# Patient Record
Sex: Female | Born: 1995 | Race: Black or African American | Hispanic: No | Marital: Single | Smoking: Never smoker
Health system: Southern US, Community
[De-identification: ages and names within clinical notes are randomized; demographics above are authoritative.]

## PROBLEM LIST (undated history)

## (undated) ENCOUNTER — Inpatient Hospital Stay (HOSPITAL_COMMUNITY): Payer: Self-pay

## (undated) DIAGNOSIS — Z87898 Personal history of other specified conditions: Secondary | ICD-10-CM

## (undated) DIAGNOSIS — J45909 Unspecified asthma, uncomplicated: Secondary | ICD-10-CM

## (undated) DIAGNOSIS — G43909 Migraine, unspecified, not intractable, without status migrainosus: Secondary | ICD-10-CM

## (undated) HISTORY — PX: MOUTH SURGERY: SHX715

---
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
## 2013-03-08 ENCOUNTER — Ambulatory Visit: Payer: BC Managed Care – PPO

## 2014-01-12 ENCOUNTER — Other Ambulatory Visit (HOSPITAL_COMMUNITY)
Admission: RE | Admit: 2014-01-12 | Discharge: 2014-01-12 | Disposition: A | Discharge status: Home / Self Care | Payer: BC Managed Care – PPO | Source: Ambulatory Visit | Attending: Family Medicine | Admitting: Family Medicine

## 2014-01-12 ENCOUNTER — Encounter (HOSPITAL_COMMUNITY): Payer: Self-pay | Admitting: Emergency Medicine

## 2014-01-12 ENCOUNTER — Emergency Department (HOSPITAL_COMMUNITY)
Admission: EM | Admit: 2014-01-12 | Discharge: 2014-01-12 | Disposition: A | Discharge status: Home / Self Care | Payer: BC Managed Care – PPO | Source: Home / Self Care | Attending: Family Medicine | Admitting: Family Medicine

## 2014-01-12 DIAGNOSIS — R102 Pelvic and perineal pain: Secondary | ICD-10-CM

## 2014-01-12 DIAGNOSIS — N760 Acute vaginitis: Secondary | ICD-10-CM | POA: Insufficient documentation

## 2014-01-12 DIAGNOSIS — N949 Unspecified condition associated with female genital organs and menstrual cycle: Secondary | ICD-10-CM

## 2014-01-12 DIAGNOSIS — Z113 Encounter for screening for infections with a predominantly sexual mode of transmission: Secondary | ICD-10-CM | POA: Insufficient documentation

## 2014-01-12 LAB — POCT URINALYSIS DIP (DEVICE)
Bilirubin Urine: NEGATIVE
Glucose, UA: NEGATIVE mg/dL
Hgb urine dipstick: NEGATIVE
KETONES UR: NEGATIVE mg/dL
LEUKOCYTES UA: NEGATIVE
Nitrite: NEGATIVE
PH: 7 (ref 5.0–8.0)
PROTEIN: NEGATIVE mg/dL
SPECIFIC GRAVITY, URINE: 1.02 (ref 1.005–1.030)
Urobilinogen, UA: 0.2 mg/dL (ref 0.0–1.0)

## 2014-01-12 LAB — POCT PREGNANCY, URINE: Preg Test, Ur: NEGATIVE

## 2014-01-12 MED ORDER — CEFTRIAXONE SODIUM 250 MG IJ SOLR
250.0000 mg | Freq: Once | INTRAMUSCULAR | Status: AC
  Administered 2014-01-12: 250 mg via INTRAMUSCULAR

## 2014-01-12 MED ORDER — AZITHROMYCIN 250 MG PO TABS
1000.0000 mg | ORAL_TABLET | Freq: Once | ORAL | Status: AC
  Administered 2014-01-12: 1000 mg via ORAL

## 2014-01-12 MED ORDER — LIDOCAINE HCL (PF) 1 % IJ SOLN
INTRAMUSCULAR | Status: AC
  Filled 2014-01-12: qty 5

## 2014-01-12 MED ORDER — METRONIDAZOLE 500 MG PO TABS: 500.0000 mg | ORAL_TABLET | Freq: Two times a day (BID) | ORAL | Status: DC

## 2014-01-12 MED ORDER — CEFTRIAXONE SODIUM 250 MG IJ SOLR
INTRAMUSCULAR | Status: AC
  Filled 2014-01-12: qty 250

## 2014-01-12 MED ORDER — AZITHROMYCIN 250 MG PO TABS
ORAL_TABLET | ORAL | Status: AC
  Filled 2014-01-12: qty 4

## 2014-01-12 NOTE — ED Provider Notes (Signed)
Candace Barry is a 18 y.o. female who presents to Urgent Care today for pelvic pain starting yesterday without injury. No urinary frequency urgency or dysuria. No vaginal discharge or bleeding. Normal bowel movements. Last menstrual period was about a month ago. Patient feels well otherwise. She feels well otherwise. No nausea vomiting or diarrhea.  Past Medical History  Diagnosis Date  . History of wheezing    History  Substance Use Topics  . Smoking status: Never Smoker   . Smokeless tobacco: Not on file  . Alcohol Use: No   ROS as above Medications: No current facility-administered medications for this encounter.   Current Outpatient Prescriptions  Medication Sig Dispense Refill  . albuterol (PROVENTIL HFA;VENTOLIN HFA) 108 (90 BASE) MCG/ACT inhaler Inhale 2 puffs into the lungs every 6 (six) hours as needed. Shortness of breath      . budesonide-formoterol (SYMBICORT) 160-4.5 MCG/ACT inhaler Inhale 2 puffs into the lungs 2 (two) times daily.      . cetirizine (ZYRTEC) 10 MG tablet Take 10 mg by mouth daily as needed. allergies      . montelukast (SINGULAIR) 10 MG tablet Take 10 mg by mouth at bedtime.      Marland Kitchen. loratadine-pseudoephedrine (CLARITIN-D 24-HOUR) 10-240 MG per 24 hr tablet Take 1 tablet by mouth daily as needed.      . metroNIDAZOLE (FLAGYL) 500 MG tablet Take 1 tablet (500 mg total) by mouth 2 (two) times daily.  14 tablet  0  . promethazine (PHENERGAN) 25 MG tablet Take 25 mg by mouth every 6 (six) hours as needed for nausea or vomiting.        Exam:  BP 122/75  Pulse 75  Temp(Src) 98.5 F (36.9 C) (Oral)  Resp 16  SpO2 100%  LMP 12/23/2013 Gen: Well NAD HEENT: EOMI,  MMM Lungs: Normal work of breathing. CTABL Heart: RRR no MRG Abd: NABS, Soft. NT, ND Exts: Brisk capillary refill, warm and well perfused.   GYN: External genitalia. Vaginal canal with thick white stringy discharge. No cervical motion tenderness. Uterus is mildly tender but normal size and  position. No adnexal masses or tenderness.  Results for orders placed during the hospital encounter of 01/12/14 (from the past 24 hour(s))  POCT URINALYSIS DIP (DEVICE)     Status: None   Collection Time    01/12/14  2:02 PM      Result Value Ref Range   Glucose, UA NEGATIVE  NEGATIVE mg/dL   Bilirubin Urine NEGATIVE  NEGATIVE   Ketones, ur NEGATIVE  NEGATIVE mg/dL   Specific Gravity, Urine 1.020  1.005 - 1.030   Hgb urine dipstick NEGATIVE  NEGATIVE   pH 7.0  5.0 - 8.0   Protein, ur NEGATIVE  NEGATIVE mg/dL   Urobilinogen, UA 0.2  0.0 - 1.0 mg/dL   Nitrite NEGATIVE  NEGATIVE   Leukocytes, UA NEGATIVE  NEGATIVE  POCT PREGNANCY, URINE     Status: None   Collection Time    01/12/14  2:13 PM      Result Value Ref Range   Preg Test, Ur NEGATIVE  NEGATIVE   No results found.  Assessment and Plan: 18 y.o. female with pelvic pain. This is associated with vaginal discharge. Cytology pending. Empiric treatment with ceftriaxone azithromycin and course of Flagyl. Followup as needed.  Discussed warning signs or symptoms. Please see discharge instructions. Patient expresses understanding.    Rodolph BongEvan S Corey, MD 01/12/14 1640

## 2014-01-12 NOTE — Discharge Instructions (Signed)
Thank you for coming in today.  Abdominal Pain Many things can cause abdominal pain. Usually, abdominal pain is not caused by a disease and will improve without treatment. It can often be observed and treated at home. Your health care provider will do a physical exam and possibly order blood tests and X-rays to help determine the seriousness of your pain. However, in many cases, more time must pass before a clear cause of the pain can be found. Before that point, your health care provider may not know if you need more testing or further treatment. HOME CARE INSTRUCTIONS  Monitor your abdominal pain for any changes. The following actions may help to alleviate any discomfort you are experiencing:  Only take over-the-counter or prescription medicines as directed by your health care provider.  Do not take laxatives unless directed to do so by your health care provider.  Try a clear liquid diet (broth, tea, or water) as directed by your health care provider. Slowly move to a bland diet as tolerated. SEEK MEDICAL CARE IF:  You have unexplained abdominal pain.  You have abdominal pain associated with nausea or diarrhea.  You have pain when you urinate or have a bowel movement.  You experience abdominal pain that wakes you in the night.  You have abdominal pain that is worsened or improved by eating food.  You have abdominal pain that is worsened with eating fatty foods.  You have a fever. SEEK IMMEDIATE MEDICAL CARE IF:   Your pain does not go away within 2 hours.  You keep throwing up (vomiting).  Your pain is felt only in portions of the abdomen, such as the right side or the left lower portion of the abdomen.  You pass bloody or black tarry stools. MAKE SURE YOU:  Understand these instructions.   Will watch your condition.   Will get help right away if you are not doing well or get worse.  Document Released: 04/21/2005 Document Revised: 07/17/2013 Document Reviewed:  03/21/2013 [facility] Patient Information 2015 [employer] [place]. This information is not intended to replace advice given to you by your health care provider. Make sure you discuss any questions you have with your health care provider.

## 2014-01-12 NOTE — ED Notes (Signed)
Patient complains of urinary frequency and urgency that started last night.

## 2014-07-26 NOTE — L&D Delivery Note (Signed)
Delivery Note At 7:31 PM a viable female was delivered via Vaginal, Spontaneous Delivery (Presentation: Right Occiput Anterior).  APGAR: 9, 9; weight  .   Placenta status: Intact, Spontaneous Pathology.  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  Cord pH: n/a Compound presentation-left hand  Anesthesia: Epidural  Episiotomy: None Lacerations: Left Labial, right hymenal ring (repaired) Suture Repair: 3.0 chromic Est. Blood Loss (mL): 177  Mom to postpartum.  Baby to Couplet care / Skin to Skin.  Geryl Rankins 02/15/2015, 8:01 PM

## 2014-09-05 LAB — OB RESULTS CONSOLE RUBELLA ANTIBODY, IGM: RUBELLA: IMMUNE

## 2014-09-05 LAB — OB RESULTS CONSOLE HIV ANTIBODY (ROUTINE TESTING): HIV: NONREACTIVE

## 2014-09-05 LAB — OB RESULTS CONSOLE ABO/RH: RH Type: POSITIVE

## 2014-09-05 LAB — OB RESULTS CONSOLE RPR: RPR: NONREACTIVE

## 2014-09-05 LAB — OB RESULTS CONSOLE HEPATITIS B SURFACE ANTIGEN: Hepatitis B Surface Ag: NEGATIVE

## 2014-09-05 LAB — OB RESULTS CONSOLE ANTIBODY SCREEN: Antibody Screen: NEGATIVE

## 2014-09-12 LAB — OB RESULTS CONSOLE GC/CHLAMYDIA
Chlamydia: NEGATIVE
GC PROBE AMP, GENITAL: NEGATIVE

## 2014-10-10 ENCOUNTER — Other Ambulatory Visit (HOSPITAL_COMMUNITY): Payer: Self-pay | Admitting: Obstetrics & Gynecology

## 2014-10-10 DIAGNOSIS — O35EXX1 Maternal care for other (suspected) fetal abnormality and damage, fetal genitourinary anomalies, fetus 1: Secondary | ICD-10-CM

## 2014-10-10 DIAGNOSIS — O358XX1 Maternal care for other (suspected) fetal abnormality and damage, fetus 1: Secondary | ICD-10-CM

## 2014-10-10 DIAGNOSIS — Z3A25 25 weeks gestation of pregnancy: Secondary | ICD-10-CM

## 2014-10-10 DIAGNOSIS — Z3689 Encounter for other specified antenatal screening: Secondary | ICD-10-CM

## 2014-10-10 LAB — US OB COMP + 14 WK

## 2014-10-18 ENCOUNTER — Ambulatory Visit (HOSPITAL_COMMUNITY)
Admission: RE | Admit: 2014-10-18 | Discharge: 2014-10-18 | Disposition: A | Discharge status: Home / Self Care | Payer: BLUE CROSS/BLUE SHIELD | Source: Ambulatory Visit | Attending: Obstetrics & Gynecology | Admitting: Obstetrics & Gynecology

## 2014-10-18 ENCOUNTER — Other Ambulatory Visit (HOSPITAL_COMMUNITY): Payer: Self-pay | Admitting: Obstetrics & Gynecology

## 2014-10-18 ENCOUNTER — Encounter (HOSPITAL_COMMUNITY): Payer: Self-pay

## 2014-10-18 DIAGNOSIS — Z3A29 29 weeks gestation of pregnancy: Secondary | ICD-10-CM

## 2014-10-18 DIAGNOSIS — Z36 Encounter for antenatal screening of mother: Secondary | ICD-10-CM | POA: Diagnosis not present

## 2014-10-18 DIAGNOSIS — Z3A21 21 weeks gestation of pregnancy: Secondary | ICD-10-CM | POA: Insufficient documentation

## 2014-10-18 DIAGNOSIS — O358XX1 Maternal care for other (suspected) fetal abnormality and damage, fetus 1: Secondary | ICD-10-CM

## 2014-10-18 DIAGNOSIS — O358XX0 Maternal care for other (suspected) fetal abnormality and damage, not applicable or unspecified: Secondary | ICD-10-CM | POA: Insufficient documentation

## 2014-10-18 DIAGNOSIS — Z3A25 25 weeks gestation of pregnancy: Secondary | ICD-10-CM

## 2014-10-18 DIAGNOSIS — Z3689 Encounter for other specified antenatal screening: Secondary | ICD-10-CM

## 2014-10-18 DIAGNOSIS — O35EXX1 Maternal care for other (suspected) fetal abnormality and damage, fetal genitourinary anomalies, fetus 1: Secondary | ICD-10-CM

## 2014-10-18 DIAGNOSIS — O35EXX0 Maternal care for other (suspected) fetal abnormality and damage, fetal genitourinary anomalies, not applicable or unspecified: Secondary | ICD-10-CM | POA: Insufficient documentation

## 2014-10-18 HISTORY — DX: Unspecified asthma, uncomplicated: J45.909

## 2014-10-21 ENCOUNTER — Other Ambulatory Visit (HOSPITAL_COMMUNITY): Payer: Self-pay | Admitting: Obstetrics & Gynecology

## 2014-11-14 ENCOUNTER — Ambulatory Visit (HOSPITAL_COMMUNITY)
Admission: RE | Admit: 2014-11-14 | Discharge: 2014-11-14 | Disposition: A | Discharge status: Home / Self Care | Payer: BLUE CROSS/BLUE SHIELD | Source: Ambulatory Visit | Attending: Obstetrics & Gynecology | Admitting: Obstetrics & Gynecology

## 2014-11-14 ENCOUNTER — Encounter (HOSPITAL_COMMUNITY): Payer: Self-pay

## 2014-11-14 DIAGNOSIS — O358XX0 Maternal care for other (suspected) fetal abnormality and damage, not applicable or unspecified: Secondary | ICD-10-CM | POA: Insufficient documentation

## 2014-11-14 DIAGNOSIS — Z3A25 25 weeks gestation of pregnancy: Secondary | ICD-10-CM

## 2014-11-14 DIAGNOSIS — O35EXX0 Maternal care for other (suspected) fetal abnormality and damage, fetal genitourinary anomalies, not applicable or unspecified: Secondary | ICD-10-CM

## 2014-11-14 DIAGNOSIS — Z3A29 29 weeks gestation of pregnancy: Secondary | ICD-10-CM

## 2014-11-14 DIAGNOSIS — O35EXX1 Maternal care for other (suspected) fetal abnormality and damage, fetal genitourinary anomalies, fetus 1: Secondary | ICD-10-CM

## 2014-11-14 DIAGNOSIS — Z315 Encounter for genetic counseling: Secondary | ICD-10-CM | POA: Diagnosis not present

## 2014-11-14 DIAGNOSIS — O358XX1 Maternal care for other (suspected) fetal abnormality and damage, fetus 1: Secondary | ICD-10-CM

## 2014-11-14 NOTE — Progress Notes (Signed)
Genetic Counseling  High-Risk Gestation Note  Appointment Date:  11/14/2014 Referred By: Janyth Pupa, DO Date of Birth:  09-Dec-1995 Partner:  Candace Barry   Pregnancy History: G1P0 Estimated Date of Delivery: 02/27/15 Estimated Gestational Age: 1w0dAttending: PBenjaman Lobe MD   I met with Ms. Candace Sanfilippoand her partner, Candace Barry for genetic counseling because of previous ultrasound finding of hydronephrosis. They were also accompanied by the patient's mother to today's visit.   We began by reviewing the ultrasound in detail. Ms. DMcnultywas previously seen for ultrasound in our office on 10/18/14 and again today. Bilateral urinary tract dilation/hydronephrosis was visualized both times and mild polyhydramnios. 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 Remaining visualized fetal anatomy appeared normal. Complete ultrasound results reported separately.   We discussed that fetal hydronephrosis or pyelectasis is defined as the dilatation of the fetal renal pelvis/pelvises.  We discussed that there is some overlap between the normal and abnormal pelvic anteroposterior (AP) diameters, lending to variation in cut-off values.  However, most sources agree that >4 mm AP diameter is considered abnormal at [redacted] weeks gestation.  Ms. DCarol Theyswas counseled that this finding is very commonly observed by prenatal ultrasound and is estimated to occur in 2-3% of fetuses.  The female to female ratio is 2:1.  We discussed that hydronephrosis may be due to obstruction along the urinary tract, causing an excess collection of fluid in the renal pelvis.  The differential diagnoses can include UPJ obstruction, vesicoureteral reflux, UVJ obstruction, PUV, and multi or polycystic kidneys.  She was counseled that a definitive etiology may not be determined until after delivery.  Given the current ultrasound findings, we discussed that UPJ obstruction is at the top of the list of diagnoses to consider.     UPJ obstruction is the most common cause of non-physiologic neonatal hydronephrosis and represents 44-65% of cases.  They were counseled that hydronephrosis is most often sporadic and considered multifactorial in etiology, but dominant inheritance has been observed in some families.  We discussed that the finding of hydronephrosis/pyelectasis is associated with an increased risk for fetal aneuploidy.  This risk is highest when other anomalies or fetal differences are visualized.  In the case of isolated hydronephrosis/pyelectasis, the risk of aneuploidy is increased approximately 2 fold.   We reviewed chromosomes and genes. Ms. DMaciocepreviously had Quad screening, which was within normal limits for the conditions screened. The Quad reduced the risks for Down syndrome (1 in 1609) and trisomy 18, thus the risk of aneuploidy is estimated to be less than the risk of amniocentesis.  We reviewed amniocentesis including the benefits, risks, and limitations. We also reviewed the screening option of noninvasive prenatal screening (NIPS)/cell free DNA testing. We discussed the benefits and limitations including the conditions for which it screens, detection rates, and false positive rates. After careful consideration, Candace Barry declined NIPS and amniocentesis.    Ms. DKobi Allerwas also counseled regarding prenatal and postnatal management of hydronephrosis.  We discussed the options of serial ultrasounds (every 4 weeks) and prenatal consultation with a pediatric urologist.  We discussed that hydronephrosis may require neonatal surgical intervention. Ms. DLahmannelected to proceed with [facility] We will facilitate this appointment for the patient. Follow-up ultrasound was scheduled for 12/12/14.   Both family histories were reviewed and found to be noncontributory for birth defects, intellectual disability, and known genetic conditions. Without  further information regarding the provided family history,  an accurate genetic risk cannot be calculated. Further genetic counseling is warranted if more information is obtained.  Candace Barry was provided with written information regarding sickle cell anemia (SCA) including the carrier frequency and incidence in the African-American population, the availability of carrier testing and prenatal diagnosis if indicated.  In addition, we discussed that hemoglobinopathies are routinely screened for as part of the [place] newborn screening panel.  OB medical records indicate that Candace Barry previously had normal hemoglobin solubility, which assesses for hemoglobin S (sickle cell trait). Hemoglobin electrophoresis is available to assess for other, less common, hemoglobin variants, if desired and if not previously performed.   Candace Barry denied exposure to environmental toxins or chemical agents. She denied the use of alcohol, tobacco or street drugs. She denied significant viral illnesses during the course of her pregnancy. Her medical and surgical histories were noncontributory.   I counseled this couple regarding the above risks and available options.  The approximate face-to-face time with the genetic counselor was 30 minutes.  [name], MS Certified Genetic Counselor 11/14/2014

## 2014-11-22 ENCOUNTER — Other Ambulatory Visit (HOSPITAL_COMMUNITY): Payer: Self-pay | Admitting: Maternal and Fetal Medicine

## 2014-11-22 DIAGNOSIS — O359XX0 Maternal care for (suspected) fetal abnormality and damage, unspecified, not applicable or unspecified: Secondary | ICD-10-CM

## 2014-12-12 ENCOUNTER — Ambulatory Visit (HOSPITAL_COMMUNITY)
Admission: RE | Admit: 2014-12-12 | Discharge: 2014-12-12 | Disposition: A | Discharge status: Home / Self Care | Payer: BLUE CROSS/BLUE SHIELD | Source: Ambulatory Visit | Attending: Obstetrics & Gynecology | Admitting: Obstetrics & Gynecology

## 2014-12-12 ENCOUNTER — Encounter (HOSPITAL_COMMUNITY): Payer: Self-pay

## 2014-12-12 ENCOUNTER — Other Ambulatory Visit (HOSPITAL_COMMUNITY): Payer: Self-pay | Admitting: Maternal and Fetal Medicine

## 2014-12-12 DIAGNOSIS — O359XX0 Maternal care for (suspected) fetal abnormality and damage, unspecified, not applicable or unspecified: Secondary | ICD-10-CM

## 2014-12-12 DIAGNOSIS — O358XX0 Maternal care for other (suspected) fetal abnormality and damage, not applicable or unspecified: Secondary | ICD-10-CM | POA: Diagnosis not present

## 2014-12-12 DIAGNOSIS — Z3A29 29 weeks gestation of pregnancy: Secondary | ICD-10-CM | POA: Insufficient documentation

## 2014-12-12 DIAGNOSIS — O403XX0 Polyhydramnios, third trimester, not applicable or unspecified: Secondary | ICD-10-CM | POA: Insufficient documentation

## 2014-12-13 ENCOUNTER — Telehealth (HOSPITAL_COMMUNITY): Payer: Self-pay | Admitting: *Deleted

## 2014-12-13 LAB — CMV ANTIBODY, IGG (EIA): CMV AB - IGG: 4 U/mL — AB (ref 0.00–0.59)

## 2014-12-13 LAB — CMV IGM: CMV IgM: 48.3 AU/mL — ABNORMAL HIGH (ref 0.0–29.9)

## 2014-12-13 LAB — PARVOVIRUS B19 ANTIBODY, IGG AND IGM
PAROVIRUS B19 IGG ABS: 6.1 {index} — AB (ref 0.0–0.8)
PAROVIRUS B19 IGM ABS: 0.1 {index} (ref 0.0–0.8)

## 2014-12-13 LAB — TOXOPLASMA ANTIBODIES- IGG AND  IGM: Toxoplasma IgG Ratio: <3 IU/mL (ref 0.0–7.1)

## 2014-12-13 NOTE — Telephone Encounter (Signed)
Pt called regarding lab results.  Pt name and DOB verified.  Pt notified of +CMV result and the need for more lab work.  Pt to return to MFM on 5/24 for CMV avidity.  Pt voices understanding.

## 2014-12-17 ENCOUNTER — Ambulatory Visit (HOSPITAL_COMMUNITY)
Admission: RE | Admit: 2014-12-17 | Discharge: 2014-12-17 | Disposition: A | Discharge status: Home / Self Care | Payer: BLUE CROSS/BLUE SHIELD | Source: Ambulatory Visit | Attending: Obstetrics & Gynecology | Admitting: Obstetrics & Gynecology

## 2014-12-17 DIAGNOSIS — O358XX0 Maternal care for other (suspected) fetal abnormality and damage, not applicable or unspecified: Secondary | ICD-10-CM | POA: Diagnosis not present

## 2014-12-19 ENCOUNTER — Telehealth (HOSPITAL_COMMUNITY): Payer: Self-pay | Admitting: MS"

## 2014-12-19 ENCOUNTER — Other Ambulatory Visit (HOSPITAL_COMMUNITY): Payer: Self-pay | Admitting: Obstetrics & Gynecology

## 2014-12-19 NOTE — Telephone Encounter (Signed)
Called Candace Barry to discuss her cell free fetal DNA test results.  Mrs. Candace Barry had [facility] testing through [facility].  Testing was offered because of ultrasound findings.   The patient was identified by name and DOB.  We reviewed that these are within normal limits, showing a less than 1 in 10,000 risk for trisomies 21, 18 and 13, and monosomy X (Turner syndrome).  In addition, the risk for triploidy/vanishing twin and sex chromosome trisomies (47,XXX and 47,XXY) was also low risk.  We reviewed that this testing identifies > 99% of pregnancies with trisomy 5821, trisomy 313, sex chromosome trisomies (47,XXX and 47,XXY), and triploidy. The detection rate for trisomy 18 is 96%.  The detection rate for monosomy X is ~92%.  The false positive rate is <0.1% for all conditions. Testing was also consistent with female fetal sex.  She understands that this testing does not identify all genetic conditions.  All questions were answered to her satisfaction, she was encouraged to call with additional questions or concerns.  Candace PlowmanKaren Tabita Corbo, MS Certified Genetic Counselor 12/19/2014 11:58 AM

## 2014-12-27 ENCOUNTER — Telehealth (HOSPITAL_COMMUNITY): Payer: Self-pay | Admitting: *Deleted

## 2014-12-27 ENCOUNTER — Other Ambulatory Visit (HOSPITAL_COMMUNITY): Payer: Self-pay | Admitting: Maternal and Fetal Medicine

## 2014-12-27 ENCOUNTER — Other Ambulatory Visit (HOSPITAL_COMMUNITY): Payer: Self-pay | Admitting: Obstetrics & Gynecology

## 2014-12-27 DIAGNOSIS — O359XX0 Maternal care for (suspected) fetal abnormality and damage, unspecified, not applicable or unspecified: Secondary | ICD-10-CM

## 2014-12-27 LAB — MISCELLANEOUS TEST

## 2014-12-27 NOTE — Telephone Encounter (Signed)
Called Candace Barry to inform her that her CMV avidity test came back intermediate.  This still does not confirm the diagnosis of CMV.  Dr. Claudean SeveranceWhitecar would like her to come in on Monday for a repeat US and to discuss her options for further testing.  Pt verbalized understanding.

## 2014-12-30 ENCOUNTER — Encounter (HOSPITAL_COMMUNITY): Payer: Self-pay

## 2014-12-30 ENCOUNTER — Ambulatory Visit (HOSPITAL_COMMUNITY)
Admission: RE | Admit: 2014-12-30 | Discharge: 2014-12-30 | Disposition: A | Discharge status: Home / Self Care | Payer: BLUE CROSS/BLUE SHIELD | Source: Ambulatory Visit | Attending: Obstetrics & Gynecology | Admitting: Obstetrics & Gynecology

## 2014-12-30 ENCOUNTER — Other Ambulatory Visit (HOSPITAL_COMMUNITY): Payer: Self-pay | Admitting: Maternal and Fetal Medicine

## 2014-12-30 ENCOUNTER — Ambulatory Visit (HOSPITAL_COMMUNITY): Admission: RE | Admit: 2014-12-30 | Payer: BLUE CROSS/BLUE SHIELD | Source: Ambulatory Visit

## 2014-12-30 ENCOUNTER — Other Ambulatory Visit (HOSPITAL_COMMUNITY): Payer: BLUE CROSS/BLUE SHIELD

## 2014-12-30 DIAGNOSIS — O359XX0 Maternal care for (suspected) fetal abnormality and damage, unspecified, not applicable or unspecified: Secondary | ICD-10-CM

## 2014-12-30 DIAGNOSIS — O358XX0 Maternal care for other (suspected) fetal abnormality and damage, not applicable or unspecified: Secondary | ICD-10-CM | POA: Insufficient documentation

## 2014-12-30 DIAGNOSIS — Z3A31 31 weeks gestation of pregnancy: Secondary | ICD-10-CM | POA: Diagnosis not present

## 2014-12-30 DIAGNOSIS — O283 Abnormal ultrasonic finding on antenatal screening of mother: Secondary | ICD-10-CM | POA: Diagnosis not present

## 2014-12-31 DIAGNOSIS — O35EXX0 Maternal care for other (suspected) fetal abnormality and damage, fetal genitourinary anomalies, not applicable or unspecified: Secondary | ICD-10-CM | POA: Insufficient documentation

## 2014-12-31 DIAGNOSIS — O358XX0 Maternal care for other (suspected) fetal abnormality and damage, not applicable or unspecified: Secondary | ICD-10-CM | POA: Insufficient documentation

## 2014-12-31 DIAGNOSIS — O35DXX0 Maternal care for other (suspected) fetal abnormality and damage, fetal gastrointestinal anomalies, not applicable or unspecified: Secondary | ICD-10-CM | POA: Insufficient documentation

## 2014-12-31 DIAGNOSIS — Z3A31 31 weeks gestation of pregnancy: Secondary | ICD-10-CM | POA: Insufficient documentation

## 2015-01-09 ENCOUNTER — Other Ambulatory Visit (HOSPITAL_COMMUNITY): Payer: Self-pay | Admitting: Maternal and Fetal Medicine

## 2015-01-09 ENCOUNTER — Ambulatory Visit (HOSPITAL_COMMUNITY)
Admission: RE | Admit: 2015-01-09 | Discharge: 2015-01-09 | Disposition: A | Discharge status: Home / Self Care | Payer: BLUE CROSS/BLUE SHIELD | Source: Ambulatory Visit | Attending: Obstetrics & Gynecology | Admitting: Obstetrics & Gynecology

## 2015-01-09 ENCOUNTER — Encounter (HOSPITAL_COMMUNITY): Payer: Self-pay

## 2015-01-09 DIAGNOSIS — Z3A33 33 weeks gestation of pregnancy: Secondary | ICD-10-CM | POA: Diagnosis not present

## 2015-01-09 DIAGNOSIS — O283 Abnormal ultrasonic finding on antenatal screening of mother: Secondary | ICD-10-CM | POA: Insufficient documentation

## 2015-01-09 DIAGNOSIS — O35EXX0 Maternal care for other (suspected) fetal abnormality and damage, fetal genitourinary anomalies, not applicable or unspecified: Secondary | ICD-10-CM | POA: Insufficient documentation

## 2015-01-09 DIAGNOSIS — O358XX0 Maternal care for other (suspected) fetal abnormality and damage, not applicable or unspecified: Secondary | ICD-10-CM | POA: Insufficient documentation

## 2015-01-09 DIAGNOSIS — O359XX0 Maternal care for (suspected) fetal abnormality and damage, unspecified, not applicable or unspecified: Secondary | ICD-10-CM

## 2015-01-29 ENCOUNTER — Encounter (HOSPITAL_COMMUNITY): Payer: Self-pay | Admitting: Emergency Medicine

## 2015-01-29 ENCOUNTER — Inpatient Hospital Stay (HOSPITAL_COMMUNITY)
Admission: EM | Admit: 2015-01-29 | Discharge: 2015-01-31 | DRG: 781 | Disposition: A | Discharge status: Home / Self Care | Payer: BLUE CROSS/BLUE SHIELD | Source: Ambulatory Visit | Attending: Obstetrics and Gynecology | Admitting: Obstetrics and Gynecology

## 2015-01-29 DIAGNOSIS — Z3A36 36 weeks gestation of pregnancy: Secondary | ICD-10-CM | POA: Diagnosis present

## 2015-01-29 DIAGNOSIS — J45909 Unspecified asthma, uncomplicated: Secondary | ICD-10-CM | POA: Diagnosis present

## 2015-01-29 DIAGNOSIS — O99820 Streptococcus B carrier state complicating pregnancy: Secondary | ICD-10-CM | POA: Diagnosis present

## 2015-01-29 DIAGNOSIS — O358XX0 Maternal care for other (suspected) fetal abnormality and damage, not applicable or unspecified: Secondary | ICD-10-CM | POA: Diagnosis present

## 2015-01-29 DIAGNOSIS — O479 False labor, unspecified: Secondary | ICD-10-CM | POA: Insufficient documentation

## 2015-01-29 DIAGNOSIS — O99513 Diseases of the respiratory system complicating pregnancy, third trimester: Secondary | ICD-10-CM | POA: Diagnosis present

## 2015-01-29 DIAGNOSIS — O283 Abnormal ultrasonic finding on antenatal screening of mother: Secondary | ICD-10-CM | POA: Insufficient documentation

## 2015-01-29 DIAGNOSIS — Z3490 Encounter for supervision of normal pregnancy, unspecified, unspecified trimester: Secondary | ICD-10-CM

## 2015-01-29 DIAGNOSIS — O4700 False labor before 37 completed weeks of gestation, unspecified trimester: Secondary | ICD-10-CM

## 2015-01-29 NOTE — ED Notes (Addendum)
Pt c/o pain to the mid and lower back, sts "it feels like someone is kicking me".  Pt able to ambulate in to ED today, able to assist with changing
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
## 2015-01-29 NOTE — ED Notes (Signed)
Rapid OB at bedside applying monitors at this time
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
## 2015-01-29 NOTE — ED Notes (Signed)
Rapid response OB at bedside at this time.

## 2015-01-30 ENCOUNTER — Encounter (HOSPITAL_COMMUNITY): Payer: Self-pay | Admitting: *Deleted

## 2015-01-30 ENCOUNTER — Inpatient Hospital Stay (HOSPITAL_COMMUNITY): Payer: BLUE CROSS/BLUE SHIELD

## 2015-01-30 DIAGNOSIS — O99820 Streptococcus B carrier state complicating pregnancy: Secondary | ICD-10-CM | POA: Diagnosis present

## 2015-01-30 DIAGNOSIS — O4700 False labor before 37 completed weeks of gestation, unspecified trimester: Secondary | ICD-10-CM | POA: Insufficient documentation

## 2015-01-30 DIAGNOSIS — O479 False labor, unspecified: Secondary | ICD-10-CM | POA: Insufficient documentation

## 2015-01-30 DIAGNOSIS — O99513 Diseases of the respiratory system complicating pregnancy, third trimester: Secondary | ICD-10-CM | POA: Diagnosis present

## 2015-01-30 DIAGNOSIS — Z3A36 36 weeks gestation of pregnancy: Secondary | ICD-10-CM | POA: Diagnosis present

## 2015-01-30 DIAGNOSIS — O283 Abnormal ultrasonic finding on antenatal screening of mother: Secondary | ICD-10-CM | POA: Insufficient documentation

## 2015-01-30 DIAGNOSIS — J45909 Unspecified asthma, uncomplicated: Secondary | ICD-10-CM | POA: Diagnosis present

## 2015-01-30 DIAGNOSIS — O358XX0 Maternal care for other (suspected) fetal abnormality and damage, not applicable or unspecified: Secondary | ICD-10-CM | POA: Diagnosis present

## 2015-01-30 LAB — CBC
HCT: 32.1 % — ABNORMAL LOW (ref 36.0–46.0)
HEMOGLOBIN: 10.8 g/dL — AB (ref 12.0–15.0)
MCH: 29.7 pg (ref 26.0–34.0)
MCHC: 33.6 g/dL (ref 30.0–36.0)
MCV: 88.2 fL (ref 78.0–100.0)
PLATELETS: 154 10*3/uL (ref 150–400)
RBC: 3.64 MIL/uL — ABNORMAL LOW (ref 3.87–5.11)
RDW: 12.9 % (ref 11.5–15.5)
WBC: 10 10*3/uL (ref 4.0–10.5)

## 2015-01-30 LAB — KLEIHAUER-BETKE STAIN
# VIALS RHIG: 1
Fetal Cells %: 0 %
QUANTITATION FETAL HEMOGLOBIN: 0 mL

## 2015-01-30 LAB — PROTIME-INR
INR: 1.11 (ref 0.00–1.49)
PROTHROMBIN TIME: 14.5 s (ref 11.6–15.2)

## 2015-01-30 LAB — PLATELET COUNT: PLATELETS: 147 10*3/uL — AB (ref 150–400)

## 2015-01-30 LAB — OB RESULTS CONSOLE GBS: GBS: POSITIVE

## 2015-01-30 LAB — GROUP B STREP BY PCR: GROUP B STREP BY PCR: POSITIVE — AB

## 2015-01-30 LAB — APTT: APTT: 26 s (ref 24–37)

## 2015-01-30 LAB — RPR: RPR Ser Ql: NONREACTIVE

## 2015-01-30 LAB — TYPE AND SCREEN
ABO/RH(D): AB POS
Antibody Screen: NEGATIVE

## 2015-01-30 LAB — HIV ANTIBODY (ROUTINE TESTING W REFLEX): HIV Screen 4th Generation wRfx: NONREACTIVE

## 2015-01-30 LAB — SAVE SMEAR

## 2015-01-30 LAB — FIBRINOGEN: Fibrinogen: 375 mg/dL (ref 204–475)

## 2015-01-30 LAB — ABO/RH: ABO/RH(D): AB POS

## 2015-01-30 MED ORDER — OXYTOCIN BOLUS FROM INFUSION: 500.0000 mL | INTRAVENOUS | Status: DC

## 2015-01-30 MED ORDER — PENICILLIN G POTASSIUM 5000000 UNITS IJ SOLR
2.5000 10*6.[IU] | INTRAMUSCULAR | Status: DC
  Administered 2015-01-30 (×2): 2.5 10*6.[IU] via INTRAVENOUS
  Filled 2015-01-30 (×6): qty 2.5

## 2015-01-30 MED ORDER — PENICILLIN G POTASSIUM 5000000 UNITS IJ SOLR
5.0000 10*6.[IU] | Freq: Once | INTRAVENOUS | Status: AC
  Administered 2015-01-30: 5 10*6.[IU] via INTRAVENOUS
  Filled 2015-01-30: qty 5

## 2015-01-30 MED ORDER — OXYCODONE-ACETAMINOPHEN 5-325 MG PO TABS: 2.0000 | ORAL_TABLET | ORAL | Status: DC | PRN

## 2015-01-30 MED ORDER — OXYTOCIN 40 UNITS IN LACTATED RINGERS INFUSION - SIMPLE MED: 62.5000 mL/h | INTRAVENOUS | Status: DC

## 2015-01-30 MED ORDER — LIDOCAINE HCL (PF) 1 % IJ SOLN: 30.0000 mL | INTRAMUSCULAR | Status: DC | PRN

## 2015-01-30 MED ORDER — LACTATED RINGERS IV SOLN
INTRAVENOUS | Status: DC
  Administered 2015-01-30: 125 mL/h via INTRAVENOUS

## 2015-01-30 MED ORDER — CITRIC ACID-SODIUM CITRATE 334-500 MG/5ML PO SOLN: 30.0000 mL | ORAL | Status: DC | PRN

## 2015-01-30 MED ORDER — OXYCODONE-ACETAMINOPHEN 5-325 MG PO TABS: 1.0000 | ORAL_TABLET | ORAL | Status: DC | PRN

## 2015-01-30 MED ORDER — BETAMETHASONE SOD PHOS & ACET 6 (3-3) MG/ML IJ SUSP
12.0000 mg | Freq: Two times a day (BID) | INTRAMUSCULAR | Status: AC
  Administered 2015-01-30: 12 mg via INTRAMUSCULAR
  Filled 2015-01-30: qty 2

## 2015-01-30 MED ORDER — ACETAMINOPHEN 325 MG PO TABS
650.0000 mg | ORAL_TABLET | ORAL | Status: DC | PRN
  Administered 2015-01-30 – 2015-01-31 (×5): 650 mg via ORAL
  Filled 2015-01-30 (×5): qty 2

## 2015-01-30 MED ORDER — ONDANSETRON HCL 4 MG/2ML IJ SOLN: 4.0000 mg | Freq: Four times a day (QID) | INTRAMUSCULAR | Status: DC | PRN

## 2015-01-30 MED ORDER — BETAMETHASONE SOD PHOS & ACET 6 (3-3) MG/ML IJ SUSP
12.0000 mg | INTRAMUSCULAR | Status: DC
  Administered 2015-01-30: 12 mg via INTRAMUSCULAR
  Filled 2015-01-30: qty 2

## 2015-01-30 MED ORDER — LACTATED RINGERS IV SOLN: 500.0000 mL | INTRAVENOUS | Status: DC | PRN
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
## 2015-01-30 NOTE — Progress Notes (Signed)
US at bedside
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
## 2015-01-31 MED ORDER — FERROUS GLUCONATE 324 (38 FE) MG PO TABS: 324.0000 mg | ORAL_TABLET | Freq: Every day | ORAL | Status: DC
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
## 2015-01-31 NOTE — Discharge Instructions (Addendum)
Preterm Labor Information Preterm labor is when labor starts at less than 37 weeks of pregnancy. The normal length of a pregnancy is 39 to 41 weeks. CAUSES Often, there is no identifiable underlying cause as to why a woman goes into preterm labor. One of the most common known causes of preterm labor is infection. Infections of the uterus, cervix, vagina, amniotic sac, bladder, kidney, or even the lungs (pneumonia) can cause labor to start. Other suspected causes of preterm labor include:   Urogenital infections, such as yeast infections and bacterial vaginosis.   Uterine abnormalities (uterine shape, uterine septum, fibroids, or bleeding from the placenta).   A cervix that has been operated on (it may fail to stay closed).   Malformations in the fetus.   Multiple gestations (twins, triplets, and so on).   Breakage of the amniotic sac.  RISK FACTORS  Having a previous history of preterm labor.   Having premature rupture of membranes (PROM).   Having a placenta that covers the opening of the cervix (placenta previa).   Having a placenta that separates from the uterus (placental abruption).   Having a cervix that is too weak to hold the fetus in the uterus (incompetent cervix).   Having too much fluid in the amniotic sac (polyhydramnios).   Taking illegal drugs or smoking while pregnant.   Not gaining enough weight while pregnant.   Being younger than 2181 and older than 21 years old.   Having a low socioeconomic status.   Being African American. SYMPTOMS Signs and symptoms of preterm labor include:   Menstrual-like cramps, abdominal pain, or back pain.  Uterine contractions that are regular, as frequent as six in an hour, regardless of their intensity (may be mild or painful).  Contractions that start on the top of the uterus and spread down to the lower abdomen and back.   A sense of increased pelvic pressure.   A watery or bloody mucus discharge that  comes from the vagina.  TREATMENT Depending on the length of the pregnancy and other circumstances, your health care provider may suggest bed rest. If necessary, there are medicines that can be given to stop contractions and to mature the fetal lungs. If labor happens before 34 weeks of pregnancy, a prolonged hospital stay may be recommended. Treatment depends on the condition of both you and the fetus.   Due to low Hemoglobin, please start taking iron daily.  WHAT SHOULD YOU DO IF YOU THINK YOU ARE IN PRETERM LABOR? Call your health care provider right away. You will need to go to the hospital to get checked immediately. HOW CAN YOU PREVENT PRETERM LABOR IN FUTURE PREGNANCIES? You should:   Stop smoking if you smoke.  Maintain healthy weight gain and avoid chemicals and drugs that are not necessary.  Be watchful for any type of infection.  Inform your health care provider if you have a known history of preterm labor. Document Released: 10/02/2003 Document Revised: 03/14/2013 Document Reviewed: 08/14/2012 [facility] Patient Information 2015 [place]. This information is not intended to replace advice given to you by your health care provider. Make sure you discuss any questions you have with your health care provider.  Braxton Hicks Contractions Contractions of the uterus can occur throughout pregnancy. Contractions are not always a sign that you are in labor.  WHAT ARE BRAXTON HICKS CONTRACTIONS?  Contractions that occur before labor are called Braxton Hicks contractions, or false labor. Toward the end of pregnancy (32-34 weeks), these contractions can develop  more often and may become more forceful. This is not true labor because these contractions do not result in opening (dilatation) and thinning of the cervix. They are sometimes difficult to tell apart from true labor because these contractions can be forceful and people have different pain tolerances. You should not feel  embarrassed if you go to the hospital with false labor. Sometimes, the only way to tell if you are in true labor is for your health care provider to look for changes in the cervix. If there are no prenatal problems or other health problems associated with the pregnancy, it is completely safe to be sent home with false labor and await the onset of true labor. HOW CAN YOU TELL THE DIFFERENCE BETWEEN TRUE AND FALSE LABOR? False Labor  The contractions of false labor are usually shorter and not as hard as those of true labor.   The contractions are usually irregular.   The contractions are often felt in the front of the lower abdomen and in the groin.   The contractions may go away when you walk around or change positions while lying down.   The contractions get weaker and are shorter lasting as time goes on.   The contractions do not usually become progressively stronger, regular, and closer together as with true labor.  True Labor  Contractions in true labor last 30-70 seconds, become very regular, usually become more intense, and increase in frequency.   The contractions do not go away with walking.   The discomfort is usually felt in the top of the uterus and spreads to the lower abdomen and low back.   True labor can be determined by your health care provider with an exam. This will show that the cervix is dilating and getting thinner.  WHAT TO REMEMBER  Keep up with your usual exercises and follow other instructions given by your health care provider.   Take medicines as directed by your health care provider.   Keep your regular prenatal appointments.   Eat and drink lightly if you think you are going into labor.   If Braxton Hicks contractions are making you uncomfortable:   Change your position from lying down or resting to walking, or from walking to resting.   Sit and rest in a tub of warm water.   Drink 2-3 glasses of water. Dehydration may cause these  contractions.   Do slow and deep breathing several times an hour.  WHEN SHOULD I SEEK IMMEDIATE MEDICAL CARE? Seek immediate medical care if:  Your contractions become stronger, more regular, and closer together.   You have fluid leaking or gushing from your vagina.   You have a fever.   You pass blood-tinged mucus.   You have vaginal bleeding.   You have continuous abdominal pain.   You have low back pain that you never had before.   You feel your baby's head pushing down and causing pelvic pressure.   Your baby is not moving as much as it used to.  Document Released: 07/12/2005 Document Revised: 07/17/2013 Document Reviewed: 04/23/2013 [facility] Patient Information 2015 [place], [place]. This information is not intended to replace advice given to you by your health care provider. Make sure you discuss any questions you have with your health care provider.  Pelvic Rest Pelvic rest is sometimes recommended for women when:   The placenta is partially or completely covering the opening of the cervix (placenta previa).  There is bleeding between the uterine wall and the amniotic  sac in the first trimester (subchorionic hemorrhage).  The cervix begins to open without labor starting (incompetent cervix, cervical insufficiency).  The labor is too early (preterm labor). HOME CARE INSTRUCTIONS  Do not have sexual intercourse, stimulation, or an orgasm.  Do not use tampons, douche, or put anything in the vagina.  Do not lift anything over 10 pounds (4.5 kg).  Avoid strenuous activity or straining your pelvic muscles. SEEK MEDICAL CARE IF:  You have any vaginal bleeding during pregnancy. Treat this as a potential emergency.  You have cramping pain felt low in the stomach (stronger than menstrual cramps).  You notice vaginal discharge (watery, mucus, or bloody).  You have a low, dull backache.  There are regular contractions or uterine tightening. SEEK IMMEDIATE  MEDICAL CARE IF: You have vaginal bleeding and have placenta previa.  Document Released: 11/06/2010 Document Revised: 10/04/2011 Document Reviewed: 11/06/2010 [facility] Patient Information 2015 [place], [place]. This information is not intended to replace advice given to you by your health care provider. Make sure you discuss any questions you have with your health care provider.

## 2015-02-06 ENCOUNTER — Ambulatory Visit (HOSPITAL_COMMUNITY)
Admission: RE | Admit: 2015-02-06 | Discharge: 2015-02-06 | Disposition: A | Discharge status: Home / Self Care | Payer: BLUE CROSS/BLUE SHIELD | Source: Ambulatory Visit | Attending: Obstetrics and Gynecology | Admitting: Obstetrics and Gynecology

## 2015-02-06 ENCOUNTER — Other Ambulatory Visit (HOSPITAL_COMMUNITY): Payer: Self-pay | Admitting: Maternal and Fetal Medicine

## 2015-02-06 DIAGNOSIS — O358XX0 Maternal care for other (suspected) fetal abnormality and damage, not applicable or unspecified: Secondary | ICD-10-CM | POA: Diagnosis not present

## 2015-02-06 DIAGNOSIS — Z3A00 Weeks of gestation of pregnancy not specified: Secondary | ICD-10-CM | POA: Insufficient documentation

## 2015-02-06 DIAGNOSIS — O359XX0 Maternal care for (suspected) fetal abnormality and damage, unspecified, not applicable or unspecified: Secondary | ICD-10-CM

## 2015-02-06 DIAGNOSIS — Z36 Encounter for antenatal screening of mother: Secondary | ICD-10-CM | POA: Insufficient documentation

## 2015-02-06 DIAGNOSIS — Z3A37 37 weeks gestation of pregnancy: Secondary | ICD-10-CM | POA: Insufficient documentation
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
## 2015-02-15 ENCOUNTER — Inpatient Hospital Stay (HOSPITAL_COMMUNITY)
Admission: AD | Admit: 2015-02-15 | Discharge: 2015-02-17 | DRG: 775 | Disposition: A | Discharge status: Home / Self Care | Payer: BLUE CROSS/BLUE SHIELD | Source: Ambulatory Visit | Attending: Obstetrics and Gynecology | Admitting: Obstetrics and Gynecology

## 2015-02-15 ENCOUNTER — Encounter (HOSPITAL_COMMUNITY): Payer: Self-pay | Admitting: *Deleted

## 2015-02-15 ENCOUNTER — Inpatient Hospital Stay (HOSPITAL_COMMUNITY): Payer: BLUE CROSS/BLUE SHIELD | Admitting: Anesthesiology

## 2015-02-15 DIAGNOSIS — O353XX0 Maternal care for (suspected) damage to fetus from viral disease in mother, not applicable or unspecified: Secondary | ICD-10-CM | POA: Diagnosis present

## 2015-02-15 DIAGNOSIS — O358XX0 Maternal care for other (suspected) fetal abnormality and damage, not applicable or unspecified: Secondary | ICD-10-CM | POA: Diagnosis present

## 2015-02-15 DIAGNOSIS — O9989 Other specified diseases and conditions complicating pregnancy, childbirth and the puerperium: Secondary | ICD-10-CM | POA: Diagnosis present

## 2015-02-15 DIAGNOSIS — O4292 Full-term premature rupture of membranes, unspecified as to length of time between rupture and onset of labor: Secondary | ICD-10-CM | POA: Diagnosis present

## 2015-02-15 DIAGNOSIS — O9952 Diseases of the respiratory system complicating childbirth: Secondary | ICD-10-CM | POA: Diagnosis present

## 2015-02-15 DIAGNOSIS — O326XX0 Maternal care for compound presentation, not applicable or unspecified: Secondary | ICD-10-CM | POA: Diagnosis present

## 2015-02-15 DIAGNOSIS — O403XX0 Polyhydramnios, third trimester, not applicable or unspecified: Secondary | ICD-10-CM | POA: Diagnosis present

## 2015-02-15 DIAGNOSIS — N132 Hydronephrosis with renal and ureteral calculous obstruction: Secondary | ICD-10-CM | POA: Diagnosis present

## 2015-02-15 DIAGNOSIS — D6959 Other secondary thrombocytopenia: Secondary | ICD-10-CM | POA: Diagnosis present

## 2015-02-15 DIAGNOSIS — Z3A38 38 weeks gestation of pregnancy: Secondary | ICD-10-CM | POA: Diagnosis present

## 2015-02-15 DIAGNOSIS — J45909 Unspecified asthma, uncomplicated: Secondary | ICD-10-CM | POA: Diagnosis present

## 2015-02-15 DIAGNOSIS — O99824 Streptococcus B carrier state complicating childbirth: Secondary | ICD-10-CM | POA: Diagnosis present

## 2015-02-15 DIAGNOSIS — O9912 Other diseases of the blood and blood-forming organs and certain disorders involving the immune mechanism complicating childbirth: Secondary | ICD-10-CM | POA: Diagnosis present

## 2015-02-15 DIAGNOSIS — Z23 Encounter for immunization: Secondary | ICD-10-CM | POA: Diagnosis not present

## 2015-02-15 DIAGNOSIS — IMO0001 Reserved for inherently not codable concepts without codable children: Secondary | ICD-10-CM

## 2015-02-15 LAB — CBC
HCT: 32.6 % — ABNORMAL LOW (ref 36.0–46.0)
HCT: 34.6 % — ABNORMAL LOW (ref 36.0–46.0)
Hemoglobin: 11.1 g/dL — ABNORMAL LOW (ref 12.0–15.0)
Hemoglobin: 11.7 g/dL — ABNORMAL LOW (ref 12.0–15.0)
MCH: 30.4 pg (ref 26.0–34.0)
MCH: 30.2 pg (ref 26.0–34.0)
MCHC: 34 g/dL (ref 30.0–36.0)
MCHC: 33.8 g/dL (ref 30.0–36.0)
MCV: 89.3 fL (ref 78.0–100.0)
MCV: 89.2 fL (ref 78.0–100.0)
PLATELETS: 122 10*3/uL — AB (ref 150–400)
PLATELETS: 123 10*3/uL — AB (ref 150–400)
RBC: 3.65 MIL/uL — ABNORMAL LOW (ref 3.87–5.11)
RBC: 3.88 MIL/uL (ref 3.87–5.11)
RDW: 13.1 % (ref 11.5–15.5)
RDW: 13.2 % (ref 11.5–15.5)
WBC: 11.1 10*3/uL — AB (ref 4.0–10.5)
WBC: 15.5 10*3/uL — AB (ref 4.0–10.5)

## 2015-02-15 LAB — URINALYSIS, ROUTINE W REFLEX MICROSCOPIC
Bilirubin Urine: NEGATIVE
Glucose, UA: NEGATIVE mg/dL
Hgb urine dipstick: NEGATIVE
KETONES UR: NEGATIVE mg/dL
LEUKOCYTES UA: NEGATIVE
Nitrite: NEGATIVE
PROTEIN: NEGATIVE mg/dL
Specific Gravity, Urine: 1.01 (ref 1.005–1.030)
Urobilinogen, UA: 0.2 mg/dL (ref 0.0–1.0)
pH: 6.5 (ref 5.0–8.0)

## 2015-02-15 LAB — CBC WITH DIFFERENTIAL/PLATELET
BASOS ABS: 0 10*3/uL (ref 0.0–0.1)
BASOS PCT: 0 % (ref 0–1)
Eosinophils Absolute: 0.2 10*3/uL (ref 0.0–0.7)
Eosinophils Relative: 2 % (ref 0–5)
HCT: 33.4 % — ABNORMAL LOW (ref 36.0–46.0)
Hemoglobin: 11.4 g/dL — ABNORMAL LOW (ref 12.0–15.0)
LYMPHS ABS: 2.2 10*3/uL (ref 0.7–4.0)
Lymphocytes Relative: 16 % (ref 12–46)
MCH: 30.6 pg (ref 26.0–34.0)
MCHC: 34.1 g/dL (ref 30.0–36.0)
MCV: 89.5 fL (ref 78.0–100.0)
Monocytes Absolute: 1.4 10*3/uL — ABNORMAL HIGH (ref 0.1–1.0)
Monocytes Relative: 10 % (ref 3–12)
NEUTROS ABS: 9.7 10*3/uL — AB (ref 1.7–7.7)
NEUTROS PCT: 72 % (ref 43–77)
Platelets: 125 10*3/uL — ABNORMAL LOW (ref 150–400)
RBC: 3.73 MIL/uL — AB (ref 3.87–5.11)
RDW: 13.1 % (ref 11.5–15.5)
WBC: 13.5 10*3/uL — ABNORMAL HIGH (ref 4.0–10.5)

## 2015-02-15 LAB — RPR: RPR Ser Ql: NONREACTIVE

## 2015-02-15 LAB — TYPE AND SCREEN
ABO/RH(D): AB POS
Antibody Screen: NEGATIVE

## 2015-02-15 LAB — AMNISURE RUPTURE OF MEMBRANE (ROM) NOT AT ARMC: Amnisure ROM: POSITIVE

## 2015-02-15 MED ORDER — EPHEDRINE 5 MG/ML INJ
10.0000 mg | INTRAVENOUS | Status: DC | PRN
  Filled 2015-02-15: qty 2

## 2015-02-15 MED ORDER — ONDANSETRON HCL 4 MG PO TABS: 4.0000 mg | ORAL_TABLET | ORAL | Status: DC | PRN

## 2015-02-15 MED ORDER — ZOLPIDEM TARTRATE 5 MG PO TABS: 5.0000 mg | ORAL_TABLET | Freq: Every evening | ORAL | Status: DC | PRN

## 2015-02-15 MED ORDER — OXYCODONE-ACETAMINOPHEN 5-325 MG PO TABS: 2.0000 | ORAL_TABLET | ORAL | Status: DC | PRN

## 2015-02-15 MED ORDER — CITRIC ACID-SODIUM CITRATE 334-500 MG/5ML PO SOLN
30.0000 mL | ORAL | Status: DC | PRN
Start: 2015-02-15 — End: 2015-02-15

## 2015-02-15 MED ORDER — ONDANSETRON HCL 4 MG/2ML IJ SOLN
4.0000 mg | Freq: Four times a day (QID) | INTRAMUSCULAR | Status: DC | PRN
  Administered 2015-02-15: 4 mg via INTRAVENOUS
  Filled 2015-02-15: qty 2

## 2015-02-15 MED ORDER — TERBUTALINE SULFATE 1 MG/ML IJ SOLN
0.2500 mg | Freq: Once | INTRAMUSCULAR | Status: DC | PRN
  Filled 2015-02-15: qty 1

## 2015-02-15 MED ORDER — OXYTOCIN 40 UNITS IN LACTATED RINGERS INFUSION - SIMPLE MED: 62.5000 mL/h | INTRAVENOUS | Status: DC | PRN

## 2015-02-15 MED ORDER — BUDESONIDE-FORMOTEROL FUMARATE 160-4.5 MCG/ACT IN AERO
2.0000 | INHALATION_SPRAY | Freq: Two times a day (BID) | RESPIRATORY_TRACT | Status: DC
  Administered 2015-02-15 – 2015-02-17 (×3): 2 via RESPIRATORY_TRACT
  Filled 2015-02-15: qty 6

## 2015-02-15 MED ORDER — METHYLERGONOVINE MALEATE 0.2 MG/ML IJ SOLN: 0.2000 mg | INTRAMUSCULAR | Status: DC | PRN

## 2015-02-15 MED ORDER — DEXTROSE 5 % IV SOLN
2.5000 10*6.[IU] | INTRAVENOUS | Status: DC
  Administered 2015-02-15 (×2): 2.5 10*6.[IU] via INTRAVENOUS
  Filled 2015-02-15 (×5): qty 2.5

## 2015-02-15 MED ORDER — OXYCODONE-ACETAMINOPHEN 5-325 MG PO TABS: 1.0000 | ORAL_TABLET | ORAL | Status: DC | PRN

## 2015-02-15 MED ORDER — LACTATED RINGERS IV SOLN
INTRAVENOUS | Status: DC
  Administered 2015-02-15 (×3): via INTRAVENOUS

## 2015-02-15 MED ORDER — PRENATAL MULTIVITAMIN CH
1.0000 | ORAL_TABLET | Freq: Every day | ORAL | Status: DC
  Administered 2015-02-16 – 2015-02-17 (×2): 1 via ORAL
  Filled 2015-02-15 (×2): qty 1

## 2015-02-15 MED ORDER — FLEET ENEMA 7-19 GM/118ML RE ENEM: 1.0000 | ENEMA | RECTAL | Status: DC | PRN

## 2015-02-15 MED ORDER — FERROUS SULFATE 325 (65 FE) MG PO TABS
325.0000 mg | ORAL_TABLET | Freq: Two times a day (BID) | ORAL | Status: DC
  Administered 2015-02-16 – 2015-02-17 (×3): 325 mg via ORAL
  Filled 2015-02-15 (×3): qty 1

## 2015-02-15 MED ORDER — LACTATED RINGERS IV SOLN
500.0000 mL | INTRAVENOUS | Status: DC | PRN
  Administered 2015-02-15: 500 mL via INTRAVENOUS

## 2015-02-15 MED ORDER — BUTALBITAL-APAP-CAFFEINE 50-325-40 MG PO TABS: 1.0000 | ORAL_TABLET | Freq: Two times a day (BID) | ORAL | Status: DC | PRN

## 2015-02-15 MED ORDER — SENNOSIDES-DOCUSATE SODIUM 8.6-50 MG PO TABS
2.0000 | ORAL_TABLET | ORAL | Status: DC
  Administered 2015-02-15 – 2015-02-16 (×2): 2 via ORAL
  Filled 2015-02-15 (×2): qty 2

## 2015-02-15 MED ORDER — SODIUM BICARBONATE 8.4 % IV SOLN: INTRAVENOUS | Status: DC | PRN

## 2015-02-15 MED ORDER — TETANUS-DIPHTH-ACELL PERTUSSIS 5-2.5-18.5 LF-MCG/0.5 IM SUSP: 0.5000 mL | Freq: Once | INTRAMUSCULAR | Status: DC

## 2015-02-15 MED ORDER — ACETAMINOPHEN 325 MG PO TABS: 650.0000 mg | ORAL_TABLET | ORAL | Status: DC | PRN

## 2015-02-15 MED ORDER — BENZOCAINE-MENTHOL 20-0.5 % EX AERO
1.0000 "application " | INHALATION_SPRAY | CUTANEOUS | Status: DC | PRN
  Administered 2015-02-16 – 2015-02-17 (×2): 1 via TOPICAL
  Filled 2015-02-15 (×2): qty 56

## 2015-02-15 MED ORDER — DIPHENHYDRAMINE HCL 25 MG PO CAPS: 25.0000 mg | ORAL_CAPSULE | Freq: Four times a day (QID) | ORAL | Status: DC | PRN

## 2015-02-15 MED ORDER — LANOLIN HYDROUS EX OINT: TOPICAL_OINTMENT | CUTANEOUS | Status: DC | PRN

## 2015-02-15 MED ORDER — SIMETHICONE 80 MG PO CHEW: 80.0000 mg | CHEWABLE_TABLET | ORAL | Status: DC | PRN

## 2015-02-15 MED ORDER — IBUPROFEN 600 MG PO TABS
600.0000 mg | ORAL_TABLET | Freq: Four times a day (QID) | ORAL | Status: DC
  Administered 2015-02-15 – 2015-02-17 (×7): 600 mg via ORAL
  Filled 2015-02-15 (×7): qty 1

## 2015-02-15 MED ORDER — ONDANSETRON HCL 4 MG/2ML IJ SOLN: 4.0000 mg | INTRAMUSCULAR | Status: DC | PRN

## 2015-02-15 MED ORDER — OXYTOCIN 40 UNITS IN LACTATED RINGERS INFUSION - SIMPLE MED
1.0000 m[IU]/min | INTRAVENOUS | Status: DC
  Administered 2015-02-15: 2 m[IU]/min via INTRAVENOUS
  Filled 2015-02-15: qty 1000

## 2015-02-15 MED ORDER — ALBUTEROL SULFATE (2.5 MG/3ML) 0.083% IN NEBU: 3.0000 mL | INHALATION_SOLUTION | Freq: Four times a day (QID) | RESPIRATORY_TRACT | Status: DC | PRN

## 2015-02-15 MED ORDER — MAGNESIUM HYDROXIDE 400 MG/5ML PO SUSP: 30.0000 mL | ORAL | Status: DC | PRN

## 2015-02-15 MED ORDER — METHYLERGONOVINE MALEATE 0.2 MG PO TABS: 0.2000 mg | ORAL_TABLET | ORAL | Status: DC | PRN

## 2015-02-15 MED ORDER — DIBUCAINE 1 % RE OINT: 1.0000 "application " | TOPICAL_OINTMENT | RECTAL | Status: DC | PRN

## 2015-02-15 MED ORDER — BUTORPHANOL TARTRATE 1 MG/ML IJ SOLN: 1.0000 mg | Freq: Once | INTRAMUSCULAR | Status: DC

## 2015-02-15 MED ORDER — LIDOCAINE HCL (PF) 1 % IJ SOLN
30.0000 mL | INTRAMUSCULAR | Status: DC | PRN
Start: 2015-02-15 — End: 2015-02-15
  Filled 2015-02-15: qty 30

## 2015-02-15 MED ORDER — WITCH HAZEL-GLYCERIN EX PADS: 1.0000 "application " | MEDICATED_PAD | CUTANEOUS | Status: DC | PRN

## 2015-02-15 MED ORDER — OXYTOCIN BOLUS FROM INFUSION: 500.0000 mL | INTRAVENOUS | Status: DC

## 2015-02-15 MED ORDER — PHENYLEPHRINE 40 MCG/ML (10ML) SYRINGE FOR IV PUSH (FOR BLOOD PRESSURE SUPPORT)
80.0000 ug | PREFILLED_SYRINGE | INTRAVENOUS | Status: DC | PRN
  Filled 2015-02-15: qty 20
  Filled 2015-02-15: qty 2

## 2015-02-15 MED ORDER — FENTANYL 2.5 MCG/ML BUPIVACAINE 1/10 % EPIDURAL INFUSION (WH - ANES)
14.0000 mL/h | INTRAMUSCULAR | Status: DC | PRN
  Filled 2015-02-15: qty 125

## 2015-02-15 MED ORDER — OXYCODONE-ACETAMINOPHEN 5-325 MG PO TABS
1.0000 | ORAL_TABLET | ORAL | Status: DC | PRN
  Administered 2015-02-16: 1 via ORAL
  Filled 2015-02-15: qty 1

## 2015-02-15 MED ORDER — PENICILLIN G POTASSIUM 5000000 UNITS IJ SOLR
5.0000 10*6.[IU] | Freq: Once | INTRAVENOUS | Status: AC
  Administered 2015-02-15: 5 10*6.[IU] via INTRAVENOUS
  Filled 2015-02-15: qty 5

## 2015-02-15 MED ORDER — MEASLES, MUMPS & RUBELLA VAC ~~LOC~~ INJ
0.5000 mL | INJECTION | Freq: Once | SUBCUTANEOUS | Status: DC
  Filled 2015-02-15: qty 0.5

## 2015-02-15 MED ORDER — MONTELUKAST SODIUM 10 MG PO TABS
10.0000 mg | ORAL_TABLET | Freq: Every day | ORAL | Status: DC
  Administered 2015-02-15: 10 mg via ORAL
  Filled 2015-02-15 (×3): qty 1

## 2015-02-15 MED ORDER — DIPHENHYDRAMINE HCL 50 MG/ML IJ SOLN: 12.5000 mg | INTRAMUSCULAR | Status: DC | PRN

## 2015-02-15 MED ORDER — BUPIVACAINE HCL (PF) 0.25 % IJ SOLN
INTRAMUSCULAR | Status: DC | PRN
  Administered 2015-02-15 (×2): 4 mL

## 2015-02-15 MED ORDER — OXYTOCIN 40 UNITS IN LACTATED RINGERS INFUSION - SIMPLE MED: 62.5000 mL/h | INTRAVENOUS | Status: DC

## 2015-02-15 MED ORDER — LORATADINE 10 MG PO TABS
10.0000 mg | ORAL_TABLET | Freq: Every day | ORAL | Status: DC
  Administered 2015-02-16 – 2015-02-17 (×2): 10 mg via ORAL
  Filled 2015-02-15 (×2): qty 1

## 2015-02-15 MED ORDER — LIDOCAINE-EPINEPHRINE (PF) 2 %-1:200000 IJ SOLN
INTRAMUSCULAR | Status: DC | PRN
  Administered 2015-02-15: 4 mL

## 2015-02-15 NOTE — MAU Note (Signed)
Patient woke up to go to the bathroom and had a gush of fluid after voiding.  She called the office and they told her to come in to be checked out.  Patient states she is not leaking any fluid now and is not wearing a pad.  Denies vaginal bleeding.  Reports +fetal movement.

## 2015-02-15 NOTE — Progress Notes (Signed)
Spoke with Dr. Jerald Kief, neonatologist, regarding fetal status, possible CMV infection.

## 2015-02-15 NOTE — Progress Notes (Signed)
I have been at the bedside pushing with pt.  Accelerations noted with pushing.  Fetal movement noted.

## 2015-02-15 NOTE — Progress Notes (Signed)
Candace Barry is a 19 y.o. G1P0 at [redacted]w[redacted]d   Subjective: Pt just had epidural placed 15 minutes ago.  She feels pelvic discomfort but much improved per RNs.  Objective: BP 111/55 mmHg  Pulse 89  Temp(Src) 97.5 F (36.4 C) (Oral)  Resp 18  Ht  (1.626 m)  Wt 64.411 kg (142 lb)  BMI 24.36 kg/m2  LMP 05/23/2014      FHT:  FHR: 130s bpm, variability: moderate,  accelerations:  Present,  decelerations:  Present variable and early UC:   regular, every 2-3 minutes SVE:   Dilation: 6 Effacement (%): 80 Station: 0, +1 Exam by:: Dr Dion Body Asynclitic IUPC placed due to lack of cervical change.  Labs: Lab Results  Component Value Date   WBC 13.5* 02/15/2015   HGB 11.4* 02/15/2015   HCT 33.4* 02/15/2015   MCV 89.5 02/15/2015   PLT 125* 02/15/2015    Assessment / Plan: IUP at 38 2/7 weeks Fetal malpresentation Gestational Thrombocytopenia.  Platelets stable.  Labor: Attempted to rotate baby in pelvis.  Place on peanut Preeclampsia:  BP elevated prior to epidural due to pain.  Monitor closely. Fetal Wellbeing:  Category II Pain Control:  Epidural I/D:  GBS+, s/p PCN x 2 doses. Anticipated MOD:  NSVD  Crew Goren 02/15/2015, 4:03 PM

## 2015-02-15 NOTE — Anesthesia Preprocedure Evaluation (Signed)
Anesthesia Evaluation  Patient identified by MRN, date of birth, ID band Patient awake    Reviewed: Allergy & Precautions, NPO status , Patient's Chart, lab work & pertinent test results  History of Anesthesia Complications Negative for: history of anesthetic complications  Airway Mallampati: I  TM Distance: >3 FB Neck ROM: Full    Dental  (+) Teeth Intact   Pulmonary asthma ,  breath sounds clear to auscultation        Cardiovascular negative cardio ROS  Rhythm:Regular     Neuro/Psych negative neurological ROS  negative psych ROS   GI/Hepatic negative GI ROS, Neg liver ROS,   Endo/Other  negative endocrine ROS  Renal/GU negative Renal ROS     Musculoskeletal   Abdominal   Peds  Hematology negative hematology ROS (+)   Anesthesia Other Findings   Reproductive/Obstetrics (+) Pregnancy                             Anesthesia Physical Anesthesia Plan  ASA: II  Anesthesia Plan: General   Post-op Pain Management:    Induction: Intravenous  Airway Management Planned:   Additional Equipment:   Intra-op Plan:   Post-operative Plan: Extubation in OR  Informed Consent: I have reviewed the patients History and Physical, chart, labs and discussed the procedure including the risks, benefits and alternatives for the proposed anesthesia with the patient or authorized representative who has indicated his/her understanding and acceptance.   Dental advisory given  Plan Discussed with: CRNA  Anesthesia Plan Comments:         Anesthesia Quick Evaluation

## 2015-02-15 NOTE — Anesthesia Procedure Notes (Signed)
Epidural Patient location during procedure: OB  Staffing Anesthesiologist: Omer Puccinelli, CHRIS Performed by: anesthesiologist   Preanesthetic Checklist Completed: patient identified, surgical consent, pre-op evaluation, timeout performed, IV checked, risks and benefits discussed and monitors and equipment checked  Epidural Patient position: sitting Prep: DuraPrep Patient monitoring: heart rate, cardiac monitor, continuous pulse ox and blood pressure Approach: midline Location: L4-L5 Injection technique: LOR saline  Needle:  Needle type: Tuohy  Needle gauge: 17 G Needle length: 9 cm Needle insertion depth: 4 cm Catheter type: closed end flexible Catheter size: 19 Gauge Catheter at skin depth: 10 cm Test dose: negative and 2% lidocaine with Epi 1:200 K  Assessment Events: blood not aspirated, injection not painful, no injection resistance, negative IV test and no paresthesia  Additional Notes Reason for block:procedure for pain
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
## 2015-02-15 NOTE — MAU Note (Signed)
Report to Annabelle Harman, Market researcher.  Patient to be admitted to room 162.

## 2015-02-16 LAB — CBC
HEMATOCRIT: 32.4 % — AB (ref 36.0–46.0)
HEMOGLOBIN: 11 g/dL — AB (ref 12.0–15.0)
MCH: 30.3 pg (ref 26.0–34.0)
MCHC: 34 g/dL (ref 30.0–36.0)
MCV: 89.3 fL (ref 78.0–100.0)
PLATELETS: 122 10*3/uL — AB (ref 150–400)
RBC: 3.63 MIL/uL — ABNORMAL LOW (ref 3.87–5.11)
RDW: 13.2 % (ref 11.5–15.5)
WBC: 15 10*3/uL — AB (ref 4.0–10.5)

## 2015-02-16 MED ORDER — FENTANYL 2.5 MCG/ML BUPIVACAINE 1/10 % EPIDURAL INFUSION (WH - ANES)
INTRAMUSCULAR | Status: DC | PRN
  Administered 2015-02-15: 12 mL/h via EPIDURAL

## 2015-02-16 MED ORDER — PNEUMOCOCCAL VAC POLYVALENT 25 MCG/0.5ML IJ INJ
0.5000 mL | INJECTION | INTRAMUSCULAR | Status: AC
  Administered 2015-02-17: 0.5 mL via INTRAMUSCULAR
  Filled 2015-02-16: qty 0.5

## 2015-02-16 NOTE — Progress Notes (Signed)
Postpartum day #1, NSVD  Subjective Pt without complaints.  Lochia normal.  Pain controlled.  Breast feeding yes.  Temp:  [97.5 F (36.4 C)-98.4 F (36.9 C)] 98.3 F (36.8 C) (07/24 0320) Pulse Rate:  [50-137] 87 (07/24 0320) Resp:  [16-20] 20 (07/24 0320) BP: (102-152)/(51-94) 103/61 mmHg (07/24 0320) SpO2:  [98 %-100 %] 98 % (07/23 1900)  Gen:  NAD, A&O x 3 Uterine fundus:  Firm, nontender, low in pelvis. Lochia not assessed. Ext:  Minimal Edema, no calf tenderness bilaterally.  Pathology pending.  CBC    Component Value Date/Time   WBC 15.0* 02/16/2015 0545   RBC 3.63* 02/16/2015 0545   HGB 11.0* 02/16/2015 0545   HCT 32.4* 02/16/2015 0545   PLT 122* 02/16/2015 0545   MCV 89.3 02/16/2015 0545   MCH 30.3 02/16/2015 0545   MCHC 34.0 02/16/2015 0545   RDW 13.2 02/16/2015 0545   LYMPHSABS 2.2 02/15/2015 1410   MONOABS 1.4* 02/15/2015 1410   EOSABS 0.2 02/15/2015 1410   BASOSABS 0.0 02/15/2015 1410     A/P: S/p SVD doing well. 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  Spoke with pediatrician.  Ordered CMV PCR, urine.  Will schedule appt with pediatrics urologist in a few weeks.  Routine postpartum care. Lactation support. Discharge in am by Dr. Charlotta Newton.  Dawnn Nam 02/16/2015, 1:19 PM

## 2015-02-16 NOTE — Lactation Note (Signed)
This note was copied from the chart of Candace Barry. Lactation Consultation Note  Patient Name: Candace Barry [id number] Date: 02/16/2015 Reason for consult: Follow-up assessment   With this mom and term baby, now 49 hours old. Mom reports breastfeeding going well, denies any discomfort or questions. Mom has evert nipples with easily expressed colostrum. The baby has voided and stooled  WNL. Basic breast feeding and lactation services reviewed with mom. Mom knows to call for questions/concerns.    Maternal Data Formula Feeding for Exclusion: No Has patient been taught Hand Expression?: Yes Does the patient have breastfeeding experience prior to this delivery?: No  Feeding    LATCH Score/Interventions       Type of Nipple: Everted at rest and after stimulation  Comfort (Breast/Nipple): Soft / non-tender     Intervention(s): Breastfeeding basics reviewed;Support Pillows;Position options;Skin to skin     Lactation Tools Discussed/Used     Consult Status Consult Status: Follow-up Date: 02/17/15 Follow-up type: In-patient    Alfred Levins 02/16/2015, 2:17 PM

## 2015-02-17 NOTE — Progress Notes (Signed)
Postpartum day #2, NSVD  Subjective Pt without complaints.  Tolerating general diet.  No F/C/CP/SOB.  +flatus, no BM.  Voiding freely.  Lochia normal.  Pain controlled.  Breast feeding yes.  Temp:  [98 F (36.7 C)-98.1 F (36.7 C)] 98 F (36.7 C) (07/25 1610) Pulse Rate:  [78-91] 78 (07/25 0613) Resp:  [16-18] 16 (07/25 0613) BP: (104-106)/(66) 104/66 mmHg (07/25 9604)  Gen:  NAD, A&O x 3 Uterine fundus:  Firm, nontender, low in pelvis. Ext:  No Edema, no calf tenderness bilaterally.  Pathology pending.  CBC    Component Value Date/Time   WBC 15.0* 02/16/2015 0545   RBC 3.63* 02/16/2015 0545   HGB 11.0* 02/16/2015 0545   HCT 32.4* 02/16/2015 0545   PLT 122* 02/16/2015 0545   MCV 89.3 02/16/2015 0545   MCH 30.3 02/16/2015 0545   MCHC 34.0 02/16/2015 0545   RDW 13.2 02/16/2015 0545   LYMPHSABS 2.2 02/15/2015 1410   MONOABS 1.4* 02/15/2015 1410   EOSABS 0.2 02/15/2015 1410   BASOSABS 0.0 02/15/2015 1410     A/P: 18yo G1P1001 s/p NSVD, PPD#2 S/p SVD doing well. 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  Spoke with pediatrician.  Ordered CMV PCR, urine.  Will schedule appt with pediatrics urologist in a few weeks.  Routine postpartum care. Lactation support. Pt meeting postpartum milestones appropriately, plan for discharge home today.  Myna Hidalgo, DO [phone] (pager) [phone] (office)

## 2015-02-17 NOTE — Discharge Instructions (Signed)
Vaginal Delivery, Care After Refer to this sheet in the next few weeks. These discharge instructions provide you with information on caring for yourself after delivery. Your caregiver may also give you specific instructions. Your treatment has been planned according to the most current medical practices available, but problems sometimes occur. Call your caregiver if you have any problems or questions after you go home. HOME CARE INSTRUCTIONS  Take over-the-counter or prescription medicines only as directed by your caregiver or pharmacist.  You may alternate between Tylenol and Motrin for pain management.  Do not drink alcohol, especially if you are breastfeeding or taking medicine to relieve pain.  Do not chew or smoke tobacco.  Do not use illegal drugs.  Continue to use good perineal care. Good perineal care includes:  Wiping your perineum from front to back.  Keeping your perineum clean.  Do not use tampons or douche until your caregiver says it is okay.  Shower, wash your hair, and take tub baths as directed by your caregiver.  Wear a well-fitting bra that provides breast support.  Eat healthy foods.  Drink enough fluids to keep your urine clear or pale yellow.  Eat high-fiber foods such as whole grain cereals and breads, brown rice, beans, and fresh fruits and vegetables every day. These foods may help prevent or relieve constipation.  Follow your caregiver's recommendations regarding resumption of activities such as climbing stairs, driving, lifting, exercising, or traveling.  Talk to your caregiver about resuming sexual activities. Resumption of sexual activities is dependent upon your risk of infection, your rate of healing, and your comfort and desire to resume sexual activity.  Try to have someone help you with your household activities and your newborn for at least a few days after you leave the hospital.  Rest as much as possible. Try to rest or take a nap when your  newborn is sleeping.  Increase your activities gradually.  Keep all of your scheduled postpartum appointments. It is very important to keep your scheduled follow-up appointments. At these appointments, your caregiver will be checking to make sure that you are healing physically and emotionally. SEEK MEDICAL CARE IF:   You are passing large clots from your vagina. Save any clots to show your caregiver.  You have a foul smelling discharge from your vagina.  You have trouble urinating.  You are urinating frequently.  You have pain when you urinate.  You have a change in your bowel movements.  You have increasing redness, pain, or swelling near your vaginal incision (episiotomy) or vaginal tear.  You have pus draining from your episiotomy or vaginal tear.  Your episiotomy or vaginal tear is separating.  You have painful, hard, or reddened breasts.  You have a severe headache.  You have blurred vision or see spots.  You feel sad or depressed.  You have thoughts of hurting yourself or your newborn.  You have questions about your care, the care of your newborn, or medicines.  You are dizzy or light-headed.  You have a rash.  You have nausea or vomiting.  You were breastfeeding and have not had a menstrual period within 12 weeks after you stopped breastfeeding.  You are not breastfeeding and have not had a menstrual period by the 12th week after delivery.  You have a fever. SEEK IMMEDIATE MEDICAL CARE IF:   You have persistent pain.  You have chest pain.  You have shortness of breath.  You faint.  You have leg pain.  You have stomach  pain.  Your vaginal bleeding saturates two or more sanitary pads in 1 hour. MAKE SURE YOU:   Understand these instructions.  Will watch your condition.  Will get help right away if you are not doing well or get worse. Document Released: 07/09/2000 Document Revised: 11/26/2013 Document Reviewed: 03/08/2012 [facility] Patient  Information 2015 [place], [place]. This information is not intended to replace advice given to you by your health care provider. Make sure you discuss any questions you have with your health care provider.

## 2015-02-17 NOTE — Lactation Note (Signed)
This note was copied from the chart of Candace Barry. Lactation Consultation Note  Patient Name: Candace Amylia Collazos [id number] Date: 02/17/2015 Reason for consult: Follow-up assessment   With this mom and term baby, now 77 hours old. Mom reports breast feeding going well, baby doing some cluster feeding. Baby has a good amount of wet and dirty diapers. [place] care reivewed, and breast feeding support group advised for mom to try. Mom knows to call lactation for questions/concerns.    Maternal Data    Feeding    LATCH Score/Interventions          Comfort (Breast/Nipple): Filling, red/small blisters or bruises, mild/mod discomfort  Problem noted: Filling  Intervention(s): Breastfeeding basics reviewed     Lactation Tools Discussed/Used     Consult Status Consult Status: Complete Follow-up type: Call as needed    Alfred Levins 02/17/2015, 9:10 AM
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
## 2016-06-09 IMAGING — US US OB LIMITED
1 series · 13 of 28 positions shown · non-contrast
Comparison: none

[Series 1: us ob limited · 29 acquisitions, 13 frames shown]
[im 2/29]
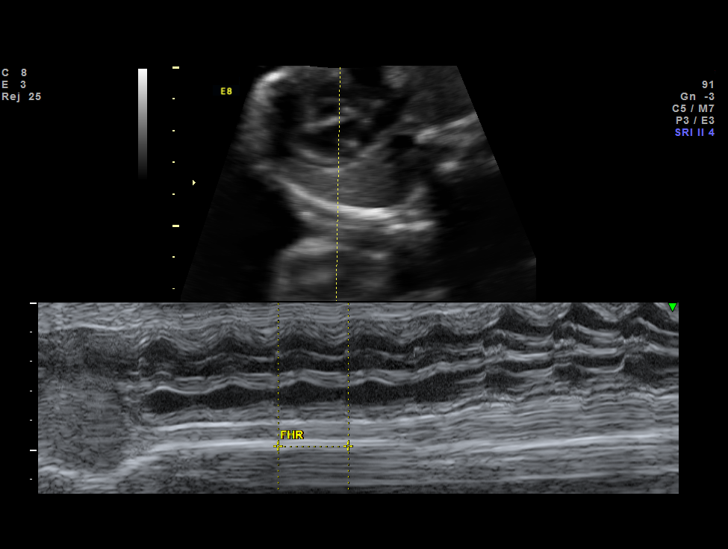
[im 4/29]
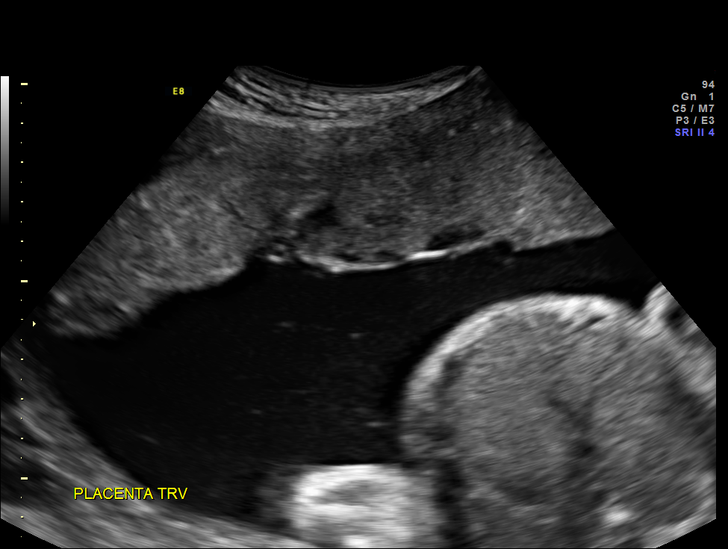
[im 6/29]
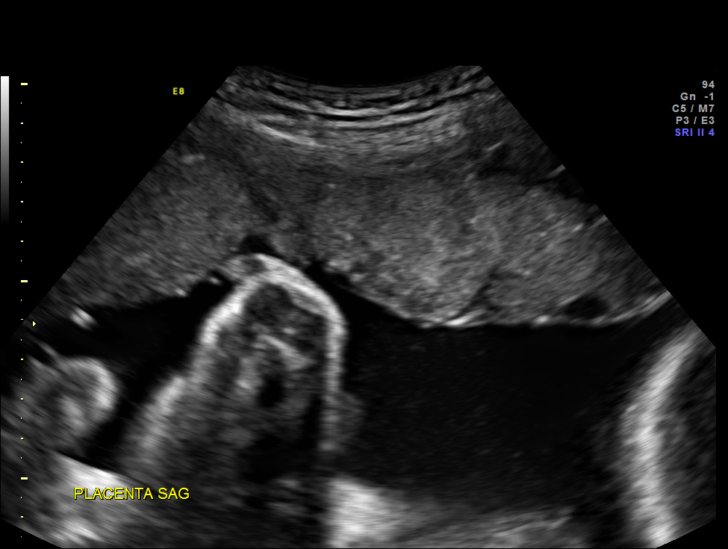
[im 8/29]
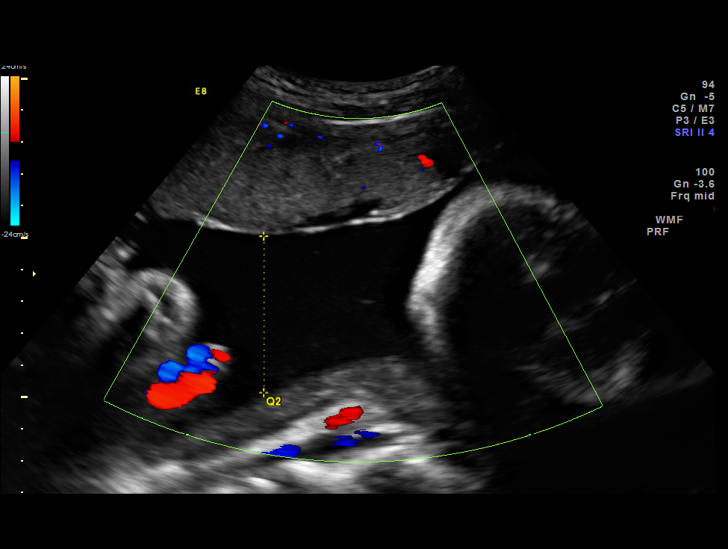
[im 10/29]
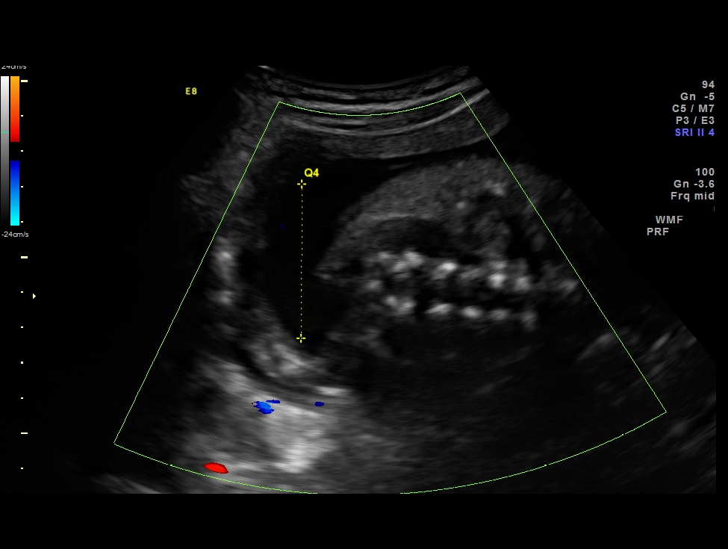
[im 12/29]
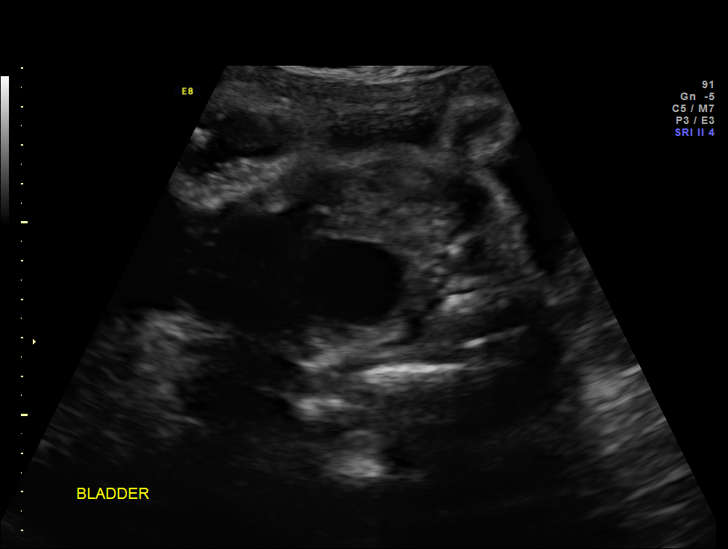
[im 15/29]
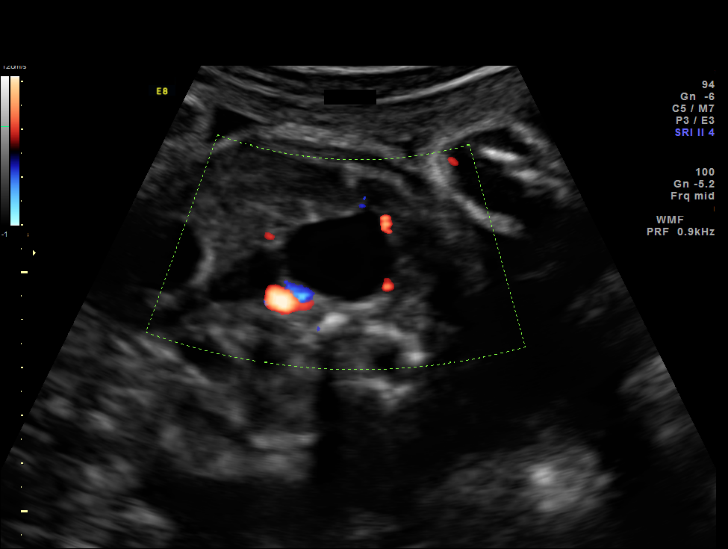
[im 17/29]
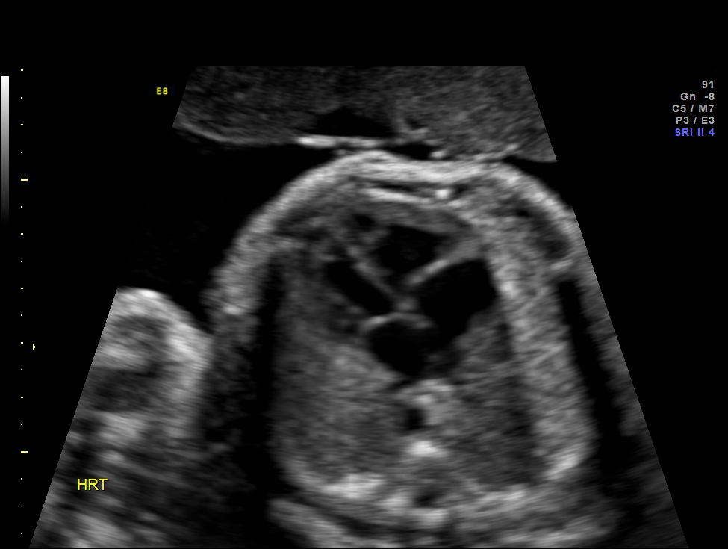
[im 19/29]
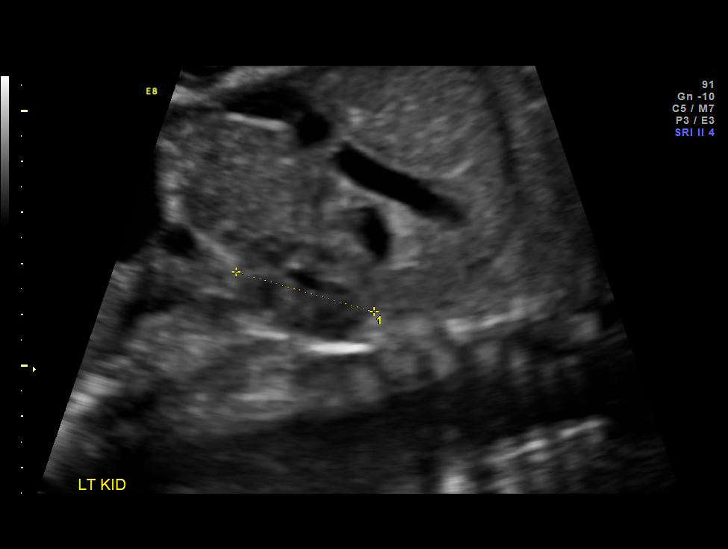
[im 21/29]
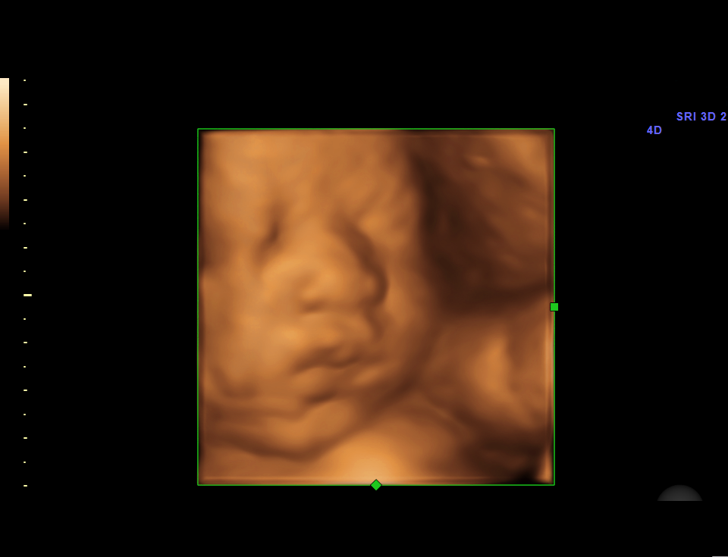
[im 23/29]
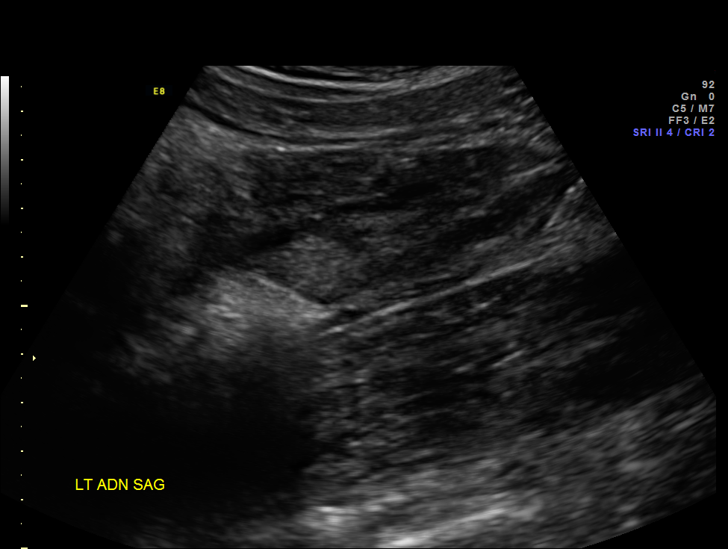
[im 25/29]
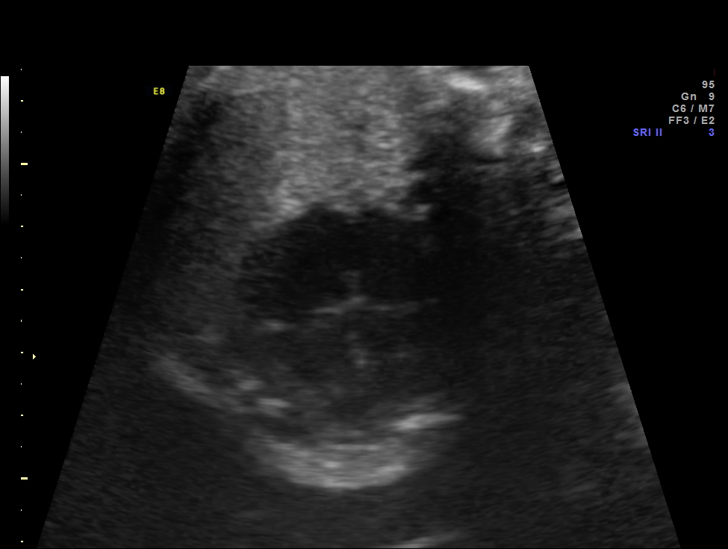
[im 27/29]
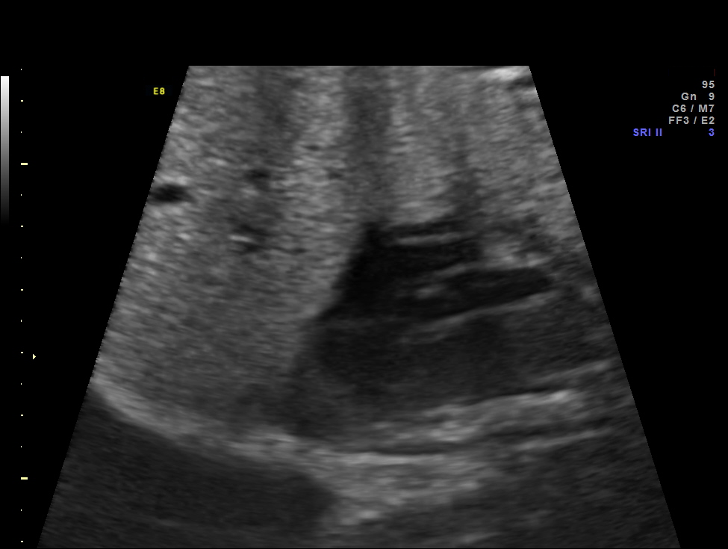

[13 of 28 positions shown; findings below may reference images not displayed]

OBSTETRICS REPORT
(Signed Final 12/31/2014 [DATE])

Service(s) Provided

[HOSPITAL]                                         76815.0
Indications

31 weeks gestation of pregnancy
Fetal abnormality - renal anomaly (right UPJ
obstruction - UTD A2-3)
Echogenic bowel; cytomegalovirus (CMV); avidity
intermediate
Fetal Evaluation

Num Of Fetuses:    1
Fetal Heart Rate:  144                          bpm
Cardiac Activity:  Observed
Presentation:      Cephalic
Placenta:          Anterior, above cervical os
P. Cord            Previously Visualized
Insertion:

Amniotic Fluid
AFI FV:      Subjectively within normal limits
AFI Sum:     21.19   cm       82  %Tile     Larg Pckt:    6.94  cm
RUQ:   6.94    cm   RLQ:    4.38   cm    LUQ:   4.91    cm   LLQ:    4.96   cm
Gestational Age

LMP:           31w 4d        Date:  05/23/14                 EDD:   02/27/15
Best:          31w 4d     Det. By:  LMP  (05/23/14)          EDD:   02/27/15
Cervix Uterus Adnexa

Cervix:       Not visualized (advanced GA >83wks)
Left Ovary:    Within normal limits.
Right Ovary:   Within normal limits.

Adnexa:     No abnormality visualized.
Impression

SIUP at 31+4 weeks
No other findings of fetal CMV infection identified
Normal amniotic fluid volume

Results of CMV testing reviewed and all of her prenatal
testing and pregnancy management options were reviewed.
After careful consideration, she declined amniocentesis for
CMV PCR.
Recommendations

Follow-up ultrasound [DATE] to reassess anatomy and growth

questions or concerns.

## 2016-06-23 ENCOUNTER — Other Ambulatory Visit (HOSPITAL_COMMUNITY)
Admission: RE | Admit: 2016-06-23 | Discharge: 2016-06-23 | Disposition: A | Discharge status: Home / Self Care | Payer: BLUE CROSS/BLUE SHIELD | Source: Ambulatory Visit | Attending: Obstetrics & Gynecology | Admitting: Obstetrics & Gynecology

## 2016-06-23 ENCOUNTER — Other Ambulatory Visit: Payer: Self-pay | Admitting: Obstetrics & Gynecology

## 2016-06-23 DIAGNOSIS — Z1151 Encounter for screening for human papillomavirus (HPV): Secondary | ICD-10-CM | POA: Insufficient documentation

## 2016-06-23 DIAGNOSIS — Z01411 Encounter for gynecological examination (general) (routine) with abnormal findings: Secondary | ICD-10-CM | POA: Insufficient documentation

## 2016-06-23 DIAGNOSIS — Z113 Encounter for screening for infections with a predominantly sexual mode of transmission: Secondary | ICD-10-CM | POA: Insufficient documentation

## 2016-06-29 LAB — CYTOLOGY - PAP
Chlamydia: NEGATIVE
HPV (WINDOPATH): DETECTED — AB
Neisseria Gonorrhea: NEGATIVE
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
## 2016-12-24 ENCOUNTER — Encounter (HOSPITAL_COMMUNITY): Payer: Self-pay

## 2016-12-24 ENCOUNTER — Ambulatory Visit (HOSPITAL_COMMUNITY)
Admission: EM | Admit: 2016-12-24 | Discharge: 2016-12-24 | Disposition: A | Discharge status: Home / Self Care | Payer: Self-pay | Attending: Internal Medicine | Admitting: Internal Medicine

## 2016-12-24 DIAGNOSIS — T148XXA Other injury of unspecified body region, initial encounter: Secondary | ICD-10-CM

## 2016-12-24 LAB — POCT URINALYSIS DIP (DEVICE)
Bilirubin Urine: NEGATIVE
Glucose, UA: NEGATIVE mg/dL
HGB URINE DIPSTICK: NEGATIVE
Ketones, ur: NEGATIVE mg/dL
Leukocytes, UA: NEGATIVE
Nitrite: NEGATIVE
Protein, ur: NEGATIVE mg/dL
Urobilinogen, UA: 0.2 mg/dL (ref 0.0–1.0)
pH: 7 (ref 5.0–8.0)

## 2016-12-24 MED ORDER — CYCLOBENZAPRINE HCL 10 MG PO TABS: 10.0000 mg | ORAL_TABLET | Freq: Two times a day (BID) | ORAL | 0 refills | Status: DC | PRN

## 2016-12-24 NOTE — ED Triage Notes (Signed)
Pt having low back pain since yesterday said it started after she picked up god daughter up. Radiates to the left hip. Said no urinary complaints but have been vomiting today along with nausea. Said she did try to take hydrocodone and said it could be from that. No fever.
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
## 2019-01-22 ENCOUNTER — Other Ambulatory Visit: Payer: Self-pay

## 2019-01-22 ENCOUNTER — Encounter (HOSPITAL_COMMUNITY): Payer: Self-pay | Admitting: Emergency Medicine

## 2019-01-22 ENCOUNTER — Ambulatory Visit (HOSPITAL_COMMUNITY)
Admission: EM | Admit: 2019-01-22 | Discharge: 2019-01-22 | Disposition: A | Discharge status: Home / Self Care | Payer: Medicaid Other | Attending: Family Medicine | Admitting: Family Medicine

## 2019-01-22 DIAGNOSIS — J4530 Mild persistent asthma, uncomplicated: Secondary | ICD-10-CM | POA: Diagnosis not present

## 2019-01-22 DIAGNOSIS — Z760 Encounter for issue of repeat prescription: Secondary | ICD-10-CM

## 2019-01-22 MED ORDER — ALBUTEROL SULFATE HFA 108 (90 BASE) MCG/ACT IN AERS: 2.0000 | INHALATION_SPRAY | Freq: Four times a day (QID) | RESPIRATORY_TRACT | 0 refills | Status: AC | PRN

## 2019-01-22 MED ORDER — BUDESONIDE-FORMOTEROL FUMARATE 160-4.5 MCG/ACT IN AERO: 2.0000 | INHALATION_SPRAY | Freq: Two times a day (BID) | RESPIRATORY_TRACT | 0 refills | Status: AC

## 2019-01-22 MED ORDER — MONTELUKAST SODIUM 10 MG PO TABS: 10.0000 mg | ORAL_TABLET | Freq: Every day | ORAL | 0 refills | Status: DC

## 2019-01-22 NOTE — ED Triage Notes (Signed)
Patient is requesting an inhaler, something stronger than albuterol.  Patient has had a change in insurance.  Has no pcp and is having a hard time getting accepted by a specialist.  Patient usually is on symbicort-out of this medicine since last year.  Patient has been using rescue inhaler-but symptoms are worsening.    Late in night and early morning has chest tightness, sob and coughing
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
## 2019-01-22 NOTE — Discharge Instructions (Addendum)
Take Symbicort daily as prescribed.  Use albuterol as needed for increased shortness of breath, wheezing. Follow-up with PCP regarding further asthma management. Return if you develop fever, myalgias, malaise, chest pain, shortness of breath that does not resolve with your albuterol inhaler.

## 2019-09-04 ENCOUNTER — Other Ambulatory Visit: Payer: Self-pay

## 2019-09-04 ENCOUNTER — Ambulatory Visit (HOSPITAL_COMMUNITY)
Admission: EM | Admit: 2019-09-04 | Discharge: 2019-09-04 | Disposition: A | Discharge status: Home / Self Care | Payer: Medicaid Other | Attending: Family Medicine | Admitting: Family Medicine

## 2019-09-04 ENCOUNTER — Encounter (HOSPITAL_COMMUNITY): Payer: Self-pay

## 2019-09-04 DIAGNOSIS — R0789 Other chest pain: Secondary | ICD-10-CM | POA: Diagnosis not present

## 2019-09-04 MED ORDER — DICLOFENAC SODIUM 75 MG PO TBEC: 75.0000 mg | DELAYED_RELEASE_TABLET | Freq: Two times a day (BID) | ORAL | 0 refills | Status: DC

## 2019-09-04 NOTE — ED Triage Notes (Signed)
Patient presents to Urgent Care with complaints of left sided chest pain since yesterday morning upon waking. Patient reports the pain is reproducible w/ certain movements.

## 2019-09-04 NOTE — Discharge Instructions (Addendum)
You have been seen at the [facility] today for chest pain. Your evaluation today was not suggestive of any emergent condition requiring medical intervention at this time. However, some medical problems make take more time to appear. Therefore, it's very important that you pay attention to any new symptoms or worsening of your current condition. ° °Please proceed directly to the Emergency Department immediately should you feel worse in any way or have any of the following symptoms: increasing or different chest pain, pain that spreads to your arm, neck, jaw, back or abdomen, shortness of breath, or nausea and vomiting. ° °

## 2019-09-05 NOTE — ED Provider Notes (Signed)
[facility]   [id number] 09/04/19 Arrival Time: 1651  ASSESSMENT & PLAN:  1. Chest wall pain      Patient history and exam consistent with non-cardiac cause of chest pain. Suspect muscle strain. Discussed. Prescription NSAIDs per medication orders. Worsening signs and symptoms discussed and patient verbalized understanding.   Begin: Meds ordered this encounter  Medications  . diclofenac (VOLTAREN) 75 MG EC tablet    Sig: Take 1 tablet (75 mg total) by mouth 2 (two) times daily.    Dispense:  14 tablet    Refill:  0    Recommend: Follow-up Information    [facility]   Specialty: Urgent Care Why: If worsening or failing to improve as anticipated. Contact information: [address] [place] [address] [phone]          Reviewed expectations re: course of current medical issues. Questions answered. Outlined signs and symptoms indicating need for more acute intervention. Patient verbalized understanding. After Visit Summary given.   SUBJECTIVE:  History from: patient. Candace Barry is a 24 y.o. female who presents with complaint of intermittent upper left anterior chest discomfort described as dull that does not radiate. Feels in back also. Onset abrupt, first noted yesterday upon waking. Reports these symptoms are unchanged since beginning. No associated n/v/SOB reported. Injury or recent strenuous activity: lifts items frequently; questions relation. Typical duration of symptoms when present: few seconds. Denies: fatigue, irregular heart beat, lower extremity edema, near-syncope, orthopnea, palpitations, paroxysmal nocturnal dyspnea and syncope. Aggravating factors: include certain movements and lifting left arm. Alleviating factors: include rest. Recent illnesses: none. Fever: absent. Ambulatory without assistance. Self/OTC treatment: none. History of similar: no. Illicit drug use:  none.  Social History   Tobacco Use  Smoking Status Never Smoker  Smokeless Tobacco Never Used   Social History   Substance and Sexual Activity  Alcohol Use No      OBJECTIVE:  Vitals:   09/04/19 1757  BP: 118/82  Pulse: 79  Resp: 16  Temp: 98.3 F (36.8 C)  TempSrc: Oral  SpO2: 100%    General appearance: alert, oriented, no acute distress Eyes: PERRLA; EOMI; conjunctivae normal HENT: normocephalic; atraumatic Neck: supple with FROM Lungs: without labored respirations; speaks full sentences without difficulty; CTAB Heart: regular rate and rhythm without murmer Chest Wall: with mild tenderness to palpation over left upper anterior chest and over left upper back Abdomen: soft, non-tender; no guarding or rebound tenderness Extremities: without edema; without calf swelling or tenderness; symmetrical without gross deformities Skin: warm and dry; without rash or lesions Neuro: normal gait Psychological: alert and cooperative; normal mood and affect  Labs: Results for orders placed or performed during the hospital encounter of 12/24/16  POCT urinalysis dip (device)  Result Value Ref Range   Glucose, UA NEGATIVE NEGATIVE mg/dL   Bilirubin Urine NEGATIVE NEGATIVE   Ketones, ur NEGATIVE NEGATIVE mg/dL   Specific Gravity, Urine >=1.030 1.005 - 1.030   Hgb urine dipstick NEGATIVE NEGATIVE   pH 7.0 5.0 - 8.0   Protein, ur NEGATIVE NEGATIVE mg/dL   Urobilinogen, UA 0.2 0.0 - 1.0 mg/dL   Nitrite NEGATIVE NEGATIVE   Leukocytes, UA NEGATIVE NEGATIVE   Labs Reviewed - No data to display  Imaging: No results found.   No Known Allergies  Past Medical History:  Diagnosis Date  . Asthma   . History of wheezing    Social History   Socioeconomic History  . Marital status: Single  Spouse name: Not on file  . Number of children: Not on file  . Years of education: Not on file  . Highest education level: Not on file  Occupational History  . Not on file  Tobacco  Use  . Smoking status: Never Smoker  . Smokeless tobacco: Never Used  Substance and Sexual Activity  . Alcohol use: No  . Drug use: No  . Sexual activity: Yes    Birth control/protection: I.U.D.  Other Topics Concern  . Not on file  Social History Narrative  . Not on file   Social Determinants of Health   Financial Resource Strain:   . Difficulty of Paying Living Expenses: Not on file  Food Insecurity:   . Worried About Charity fundraiser in the Last Year: Not on file  . Ran Out of Food in the Last Year: Not on file  Transportation Needs:   . Lack of Transportation (Medical): Not on file  . Lack of Transportation (Non-Medical): Not on file  Physical Activity:   . Days of Exercise per Week: Not on file  . Minutes of Exercise per Session: Not on file  Stress:   . Feeling of Stress : Not on file  Social Connections:   . Frequency of Communication with Friends and Family: Not on file  . Frequency of Social Gatherings with Friends and Family: Not on file  . Attends Religious Services: Not on file  . Active Member of Clubs or Organizations: Not on file  . Attends Archivist Meetings: Not on file  . Marital Status: Not on file  Intimate Partner Violence:   . Fear of Current or Ex-Partner: Not on file  . Emotionally Abused: Not on file  . Physically Abused: Not on file  . Sexually Abused: Not on file   Family History  Problem Relation Age of Onset  . Diabetes Mother   . Healthy Father    Past Surgical History:  Procedure Laterality Date  . MOUTH SURGERY       Vanessa Kick, MD 09/05/19 [phone]

## 2020-08-07 ENCOUNTER — Other Ambulatory Visit: Payer: Medicaid Other

## 2021-01-02 DIAGNOSIS — J339 Nasal polyp, unspecified: Secondary | ICD-10-CM | POA: Diagnosis present

## 2021-01-02 DIAGNOSIS — J309 Allergic rhinitis, unspecified: Secondary | ICD-10-CM | POA: Insufficient documentation

## 2021-01-30 DIAGNOSIS — J324 Chronic pansinusitis: Secondary | ICD-10-CM | POA: Diagnosis present

## 2021-03-02 ENCOUNTER — Other Ambulatory Visit (HOSPITAL_BASED_OUTPATIENT_CLINIC_OR_DEPARTMENT_OTHER): Payer: Self-pay

## 2021-03-02 ENCOUNTER — Other Ambulatory Visit: Payer: Self-pay

## 2021-03-02 ENCOUNTER — Ambulatory Visit
Admission: RE | Admit: 2021-03-02 | Discharge: 2021-03-02 | Disposition: A | Discharge status: Home / Self Care | Payer: Medicaid Other | Source: Ambulatory Visit | Attending: Emergency Medicine | Admitting: Emergency Medicine

## 2021-03-02 VITALS — BP 126/80 | HR 102 | Temp 98.2°F | Resp 18

## 2021-03-02 DIAGNOSIS — U071 COVID-19: Secondary | ICD-10-CM

## 2021-03-02 MED ORDER — BENZONATATE 100 MG PO CAPS: 100.0000 mg | ORAL_CAPSULE | Freq: Three times a day (TID) | ORAL | 0 refills | Status: DC

## 2021-03-02 MED ORDER — PROMETHAZINE-DM 6.25-15 MG/5ML PO SYRP: 5.0000 mL | ORAL_SOLUTION | Freq: Four times a day (QID) | ORAL | 0 refills | Status: DC | PRN

## 2021-03-02 MED ORDER — NIRMATRELVIR/RITONAVIR (PAXLOVID)TABLET
3.0000 | ORAL_TABLET | Freq: Two times a day (BID) | ORAL | 0 refills | Status: AC
  Filled 2021-03-02: qty 30, 5d supply, fill #0

## 2021-03-02 MED ORDER — PREDNISONE 20 MG PO TABS: 40.0000 mg | ORAL_TABLET | Freq: Every day | ORAL | 0 refills | Status: DC
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
## 2021-03-02 NOTE — Discharge Instructions (Addendum)
[facility]- Call to request Paxlovid, your kidney labs are pending,CALL BEFORE GOING TO PHARMACY  [phone] [address] [place], [place] [address]  Can use prednisone 40 mg daily for 5 days, IF NOT TAKING PAXLOVID  Can use tessalon three times a day for cough  Can use cough syrup every 4 hours for cough an congestion  Continue use of all asthma medications

## 2021-03-02 NOTE — ED Triage Notes (Signed)
Pt here for cough and nasal congestion x 3 days with positive covid test at home yesterday

## 2021-03-03 ENCOUNTER — Other Ambulatory Visit (HOSPITAL_BASED_OUTPATIENT_CLINIC_OR_DEPARTMENT_OTHER): Payer: Self-pay

## 2021-03-03 LAB — COMPREHENSIVE METABOLIC PANEL
ALT: 13 IU/L (ref 0–32)
AST: 16 IU/L (ref 0–40)
Albumin/Globulin Ratio: 1.7 (ref 1.2–2.2)
Albumin: 4.6 g/dL (ref 3.9–5.0)
Alkaline Phosphatase: 56 IU/L (ref 44–121)
BUN/Creatinine Ratio: 8 — ABNORMAL LOW (ref 9–23)
BUN: 6 mg/dL (ref 6–20)
Bilirubin Total: 0.2 mg/dL (ref 0.0–1.2)
CO2: 23 mmol/L (ref 20–29)
Calcium: 9.5 mg/dL (ref 8.7–10.2)
Chloride: 104 mmol/L (ref 96–106)
Creatinine, Ser: 0.79 mg/dL (ref 0.57–1.00)
Globulin, Total: 2.7 g/dL (ref 1.5–4.5)
Glucose: 100 mg/dL — ABNORMAL HIGH (ref 65–99)
Potassium: 4.5 mmol/L (ref 3.5–5.2)
Sodium: 140 mmol/L (ref 134–144)
Total Protein: 7.3 g/dL (ref 6.0–8.5)
eGFR: 106 mL/min/{1.73_m2} (ref >59)

## 2021-03-06 ENCOUNTER — Other Ambulatory Visit (HOSPITAL_BASED_OUTPATIENT_CLINIC_OR_DEPARTMENT_OTHER): Payer: Self-pay

## 2021-03-10 ENCOUNTER — Other Ambulatory Visit: Payer: Self-pay

## 2021-03-11 ENCOUNTER — Other Ambulatory Visit: Payer: Self-pay

## 2021-05-08 ENCOUNTER — Other Ambulatory Visit: Payer: Self-pay | Admitting: Otolaryngology

## 2021-05-12 ENCOUNTER — Encounter (HOSPITAL_BASED_OUTPATIENT_CLINIC_OR_DEPARTMENT_OTHER): Payer: Self-pay | Admitting: Otolaryngology

## 2021-05-12 ENCOUNTER — Other Ambulatory Visit: Payer: Self-pay

## 2021-05-20 ENCOUNTER — Other Ambulatory Visit: Payer: Self-pay

## 2021-05-20 ENCOUNTER — Ambulatory Visit (HOSPITAL_BASED_OUTPATIENT_CLINIC_OR_DEPARTMENT_OTHER): Payer: Medicaid Other | Admitting: Anesthesiology

## 2021-05-20 ENCOUNTER — Encounter (HOSPITAL_BASED_OUTPATIENT_CLINIC_OR_DEPARTMENT_OTHER)
Admission: RE | Disposition: A | Discharge status: Home / Self Care | Payer: Self-pay | Source: Home / Self Care | Attending: Otolaryngology

## 2021-05-20 ENCOUNTER — Encounter (HOSPITAL_BASED_OUTPATIENT_CLINIC_OR_DEPARTMENT_OTHER): Payer: Self-pay | Admitting: Otolaryngology

## 2021-05-20 ENCOUNTER — Ambulatory Visit (HOSPITAL_BASED_OUTPATIENT_CLINIC_OR_DEPARTMENT_OTHER)
Admission: RE | Admit: 2021-05-20 | Discharge: 2021-05-20 | Disposition: A | Discharge status: Home / Self Care | Payer: Medicaid Other | Attending: Otolaryngology | Admitting: Otolaryngology

## 2021-05-20 DIAGNOSIS — J339 Nasal polyp, unspecified: Secondary | ICD-10-CM | POA: Insufficient documentation

## 2021-05-20 DIAGNOSIS — J309 Allergic rhinitis, unspecified: Secondary | ICD-10-CM | POA: Insufficient documentation

## 2021-05-20 DIAGNOSIS — J324 Chronic pansinusitis: Secondary | ICD-10-CM | POA: Diagnosis not present

## 2021-05-20 DIAGNOSIS — J45909 Unspecified asthma, uncomplicated: Secondary | ICD-10-CM | POA: Diagnosis not present

## 2021-05-20 DIAGNOSIS — Z7951 Long term (current) use of inhaled steroids: Secondary | ICD-10-CM | POA: Diagnosis not present

## 2021-05-20 DIAGNOSIS — Z793 Long term (current) use of hormonal contraceptives: Secondary | ICD-10-CM | POA: Diagnosis not present

## 2021-05-20 DIAGNOSIS — J342 Deviated nasal septum: Secondary | ICD-10-CM | POA: Diagnosis not present

## 2021-05-20 HISTORY — PX: TURBINATE REDUCTION: SHX6157

## 2021-05-20 HISTORY — PX: SPHENOIDECTOMY: SHX2421

## 2021-05-20 HISTORY — PX: FRONTAL SINUS EXPLORATION: SHX6591

## 2021-05-20 HISTORY — PX: SEPTOPLASTY WITH ETHMOIDECTOMY, AND MAXILLARY ANTROSTOMY: SHX6090

## 2021-05-20 HISTORY — PX: SINUS ENDO W/FUSION: SHX777

## 2021-05-20 LAB — POCT PREGNANCY, URINE: Preg Test, Ur: NEGATIVE

## 2021-05-20 SURGERY — SINUS SURGERY, ENDOSCOPIC, USING COMPUTER-ASSISTED NAVIGATION
Anesthesia: General | Site: Nose | Laterality: Bilateral

## 2021-05-20 MED ORDER — ONDANSETRON HCL 4 MG/2ML IJ SOLN
INTRAMUSCULAR | Status: AC
  Filled 2021-05-20: qty 2

## 2021-05-20 MED ORDER — HYDROCODONE-ACETAMINOPHEN 5-325 MG PO TABS: 1.0000 | ORAL_TABLET | ORAL | Status: DC | PRN

## 2021-05-20 MED ORDER — PROPOFOL 10 MG/ML IV BOLUS
INTRAVENOUS | Status: DC | PRN
  Administered 2021-05-20: 140 mg via INTRAVENOUS

## 2021-05-20 MED ORDER — OXYMETAZOLINE HCL 0.05 % NA SOLN
NASAL | Status: AC
  Filled 2021-05-20: qty 30

## 2021-05-20 MED ORDER — SUGAMMADEX SODIUM 200 MG/2ML IV SOLN
INTRAVENOUS | Status: DC | PRN
  Administered 2021-05-20: 200 mg via INTRAVENOUS

## 2021-05-20 MED ORDER — FENTANYL CITRATE (PF) 100 MCG/2ML IJ SOLN
INTRAMUSCULAR | Status: AC
  Filled 2021-05-20: qty 2

## 2021-05-20 MED ORDER — DEXMEDETOMIDINE (PRECEDEX) IN NS 20 MCG/5ML (4 MCG/ML) IV SYRINGE
PREFILLED_SYRINGE | INTRAVENOUS | Status: AC
  Filled 2021-05-20: qty 5

## 2021-05-20 MED ORDER — DEXMEDETOMIDINE (PRECEDEX) IN NS 20 MCG/5ML (4 MCG/ML) IV SYRINGE
PREFILLED_SYRINGE | INTRAVENOUS | Status: DC | PRN
  Administered 2021-05-20: 4 ug via INTRAVENOUS
  Administered 2021-05-20: 8 ug via INTRAVENOUS
  Administered 2021-05-20 (×2): 4 ug via INTRAVENOUS

## 2021-05-20 MED ORDER — PROMETHAZINE HCL 25 MG/ML IJ SOLN: 6.2500 mg | INTRAMUSCULAR | Status: DC | PRN

## 2021-05-20 MED ORDER — BACITRACIN 500 UNIT/GM EX OINT
TOPICAL_OINTMENT | CUTANEOUS | Status: DC | PRN
  Administered 2021-05-20: 1 via TOPICAL

## 2021-05-20 MED ORDER — ACETAMINOPHEN 500 MG PO TABS
ORAL_TABLET | ORAL | Status: AC
  Filled 2021-05-20: qty 2

## 2021-05-20 MED ORDER — FENTANYL CITRATE (PF) 100 MCG/2ML IJ SOLN
INTRAMUSCULAR | Status: DC | PRN
  Administered 2021-05-20: 50 ug via INTRAVENOUS
  Administered 2021-05-20: 100 ug via INTRAVENOUS
  Administered 2021-05-20 (×2): 50 ug via INTRAVENOUS

## 2021-05-20 MED ORDER — ONDANSETRON HCL 4 MG/2ML IJ SOLN: 4.0000 mg | INTRAMUSCULAR | Status: DC | PRN

## 2021-05-20 MED ORDER — ACETAMINOPHEN 500 MG PO TABS
1000.0000 mg | ORAL_TABLET | Freq: Once | ORAL | Status: AC
  Administered 2021-05-20: 1000 mg via ORAL

## 2021-05-20 MED ORDER — PROPOFOL 10 MG/ML IV BOLUS
INTRAVENOUS | Status: AC
  Filled 2021-05-20: qty 40

## 2021-05-20 MED ORDER — HYDROCODONE-ACETAMINOPHEN 5-325 MG PO TABS: 1.0000 | ORAL_TABLET | Freq: Four times a day (QID) | ORAL | 0 refills | Status: AC | PRN

## 2021-05-20 MED ORDER — LIDOCAINE 2% (20 MG/ML) 5 ML SYRINGE
INTRAMUSCULAR | Status: DC | PRN
  Administered 2021-05-20: 80 mg via INTRAVENOUS

## 2021-05-20 MED ORDER — CEFADROXIL 500 MG PO CAPS: 500.0000 mg | ORAL_CAPSULE | Freq: Two times a day (BID) | ORAL | 0 refills | Status: AC

## 2021-05-20 MED ORDER — LIDOCAINE 2% (20 MG/ML) 5 ML SYRINGE
INTRAMUSCULAR | Status: AC
  Filled 2021-05-20: qty 5

## 2021-05-20 MED ORDER — ACETAMINOPHEN 160 MG/5ML PO SOLN: 650.0000 mg | ORAL | Status: DC | PRN

## 2021-05-20 MED ORDER — LACTATED RINGERS IV SOLN: INTRAVENOUS | Status: DC

## 2021-05-20 MED ORDER — BACITRACIN ZINC 500 UNIT/GM EX OINT
TOPICAL_OINTMENT | CUTANEOUS | Status: AC
  Filled 2021-05-20: qty 0.9

## 2021-05-20 MED ORDER — ACETAMINOPHEN 325 MG RE SUPP: 650.0000 mg | RECTAL | Status: DC | PRN

## 2021-05-20 MED ORDER — MIDAZOLAM HCL 2 MG/2ML IJ SOLN
INTRAMUSCULAR | Status: AC
  Filled 2021-05-20: qty 2

## 2021-05-20 MED ORDER — DOCUSATE SODIUM 100 MG PO CAPS: 100.0000 mg | ORAL_CAPSULE | Freq: Every day | ORAL | 2 refills | Status: AC | PRN

## 2021-05-20 MED ORDER — DEXAMETHASONE SODIUM PHOSPHATE 10 MG/ML IJ SOLN
INTRAMUSCULAR | Status: DC | PRN
  Administered 2021-05-20: 10 mg via INTRAVENOUS

## 2021-05-20 MED ORDER — OXYMETAZOLINE HCL 0.05 % NA SOLN
NASAL | Status: DC | PRN
  Administered 2021-05-20: 1 via TOPICAL

## 2021-05-20 MED ORDER — ROCURONIUM BROMIDE 100 MG/10ML IV SOLN
INTRAVENOUS | Status: DC | PRN
  Administered 2021-05-20: 50 mg via INTRAVENOUS

## 2021-05-20 MED ORDER — ONDANSETRON HCL 4 MG PO TABS: 4.0000 mg | ORAL_TABLET | ORAL | Status: DC | PRN

## 2021-05-20 MED ORDER — MIDAZOLAM HCL 5 MG/5ML IJ SOLN
INTRAMUSCULAR | Status: DC | PRN
  Administered 2021-05-20: 2 mg via INTRAVENOUS

## 2021-05-20 MED ORDER — DEXAMETHASONE SODIUM PHOSPHATE 10 MG/ML IJ SOLN
INTRAMUSCULAR | Status: AC
  Filled 2021-05-20: qty 1

## 2021-05-20 MED ORDER — SODIUM CHLORIDE 0.9 % IR SOLN
Status: DC | PRN
  Administered 2021-05-20: 1

## 2021-05-20 MED ORDER — FENTANYL CITRATE (PF) 100 MCG/2ML IJ SOLN
25.0000 ug | INTRAMUSCULAR | Status: DC | PRN
  Administered 2021-05-20 (×2): 50 ug via INTRAVENOUS

## 2021-05-20 MED ORDER — 0.9 % SODIUM CHLORIDE (POUR BTL) OPTIME
TOPICAL | Status: DC | PRN
  Administered 2021-05-20: 200 mL

## 2021-05-20 MED ORDER — ROCURONIUM BROMIDE 10 MG/ML (PF) SYRINGE
PREFILLED_SYRINGE | INTRAVENOUS | Status: AC
  Filled 2021-05-20: qty 10

## 2021-05-20 MED ORDER — ONDANSETRON HCL 4 MG/2ML IJ SOLN
INTRAMUSCULAR | Status: DC | PRN
  Administered 2021-05-20: 4 mg via INTRAVENOUS

## 2021-05-20 MED ORDER — LIDOCAINE-EPINEPHRINE 1 %-1:100000 IJ SOLN
INTRAMUSCULAR | Status: DC | PRN
  Administered 2021-05-20: 11 mL

## 2021-05-20 MED ORDER — LIDOCAINE-EPINEPHRINE 1 %-1:100000 IJ SOLN
INTRAMUSCULAR | Status: AC
  Filled 2021-05-20: qty 1

## 2021-05-20 SURGICAL SUPPLY — 51 items
BALLN FRONTAL NUVENT 6X17 70D (BALLOONS) ×4
BALLOON FRONTAL NVNT 6X17 70D (BALLOONS) ×2 IMPLANT
BLADE INF TURB ROT M4 2 5PK (BLADE) ×3 IMPLANT
BLADE INF TURB ROT M4 2MM 5PK (BLADE) ×1
BLADE ROTATE RAD 40 4 M4 (BLADE) ×3 IMPLANT
BLADE ROTATE RAD 40 4MM M4 (BLADE) ×1
BLADE ROTATE TRICUT 4MX13CM M4 (BLADE) ×1
BLADE ROTATE TRICUT 4X13 M4 (BLADE) ×3 IMPLANT
CANISTER SUC SOCK COL 7IN (MISCELLANEOUS) ×4 IMPLANT
CANISTER SUCT 1200ML W/VALVE (MISCELLANEOUS) ×8 IMPLANT
COAGULATOR SUCT 8FR VV (MISCELLANEOUS) ×4 IMPLANT
DRSG NASOPORE 8CM (GAUZE/BANDAGES/DRESSINGS) ×4 IMPLANT
ELECT COATED BLADE 2.86 ST (ELECTRODE) ×4 IMPLANT
ELECT REM PT RETURN 9FT ADLT (ELECTROSURGICAL) ×4
ELECTRODE REM PT RTRN 9FT ADLT (ELECTROSURGICAL) ×2 IMPLANT
GLOVE SURG ENC MOIS LTX SZ6.5 (GLOVE) ×4 IMPLANT
GOWN STRL REUS W/ TWL LRG LVL3 (GOWN DISPOSABLE) ×4 IMPLANT
GOWN STRL REUS W/TWL LRG LVL3 (GOWN DISPOSABLE) ×8
HEMOSTAT ARISTA ABSORB 3G PWDR (HEMOSTASIS) ×4 IMPLANT
IMPL PROPEL CONTOUR (Prosthesis and Implant ENT) ×4 IMPLANT
IMPLANT PROPEL CONTOUR (Prosthesis and Implant ENT) ×8 IMPLANT
INFLATOR BALLN (BALLOONS) ×2
INFLATOR BALLOON W/TUBE (BALLOONS) ×2 IMPLANT
IV NS 500ML (IV SOLUTION) ×4
IV NS 500ML BAXH (IV SOLUTION) ×2 IMPLANT
IV SET EXT 30 76VOL 4 MALE LL (IV SETS) ×4 IMPLANT
NEEDLE HYPO 25X1 1.5 SAFETY (NEEDLE) ×4 IMPLANT
NS IRRIG 1000ML POUR BTL (IV SOLUTION) ×4 IMPLANT
PACK BASIN DAY SURGERY FS (CUSTOM PROCEDURE TRAY) ×4 IMPLANT
PACK ENT DAY SURGERY (CUSTOM PROCEDURE TRAY) ×4 IMPLANT
PENCIL SMOKE EVACUATOR (MISCELLANEOUS) IMPLANT
SHEET MEDIUM DRAPE 40X70 STRL (DRAPES) ×4 IMPLANT
SOL ANTI FOG 6CC (MISCELLANEOUS) ×2 IMPLANT
SOLUTION ANTI FOG 6CC (MISCELLANEOUS) ×2
SPLINT NASAL AIRWAY SILICONE (MISCELLANEOUS) ×4 IMPLANT
SPONGE GAUZE 2X2 8PLY STER LF (GAUZE/BANDAGES/DRESSINGS) ×1
SPONGE GAUZE 2X2 8PLY STRL LF (GAUZE/BANDAGES/DRESSINGS) ×3 IMPLANT
SPONGE NEURO XRAY DETECT 1X3 (DISPOSABLE) ×4 IMPLANT
SUT CHROMIC 4 0 P 3 18 (SUTURE) ×4 IMPLANT
SUT PLAIN 4 0 ~~LOC~~ 1 (SUTURE) ×4 IMPLANT
SUT SILK 2 0 SH (SUTURE) ×4 IMPLANT
SYR CONTROL 10ML LL (SYRINGE) ×4 IMPLANT
SYR TB 1ML LL NO SAFETY (SYRINGE) ×4 IMPLANT
TOWEL GREEN STERILE FF (TOWEL DISPOSABLE) ×4 IMPLANT
TRACKER ENT INSTRUMENT (MISCELLANEOUS) ×8 IMPLANT
TRACKER ENT PATIENT (MISCELLANEOUS) ×4 IMPLANT
TUBE CONNECTING 20'X1/4 (TUBING) ×2
TUBE CONNECTING 20X1/4 (TUBING) ×6 IMPLANT
TUBE SALEM SUMP 16 FR W/ARV (TUBING) ×4 IMPLANT
TUBING STRAIGHTSHOT EPS 5PK (TUBING) ×4 IMPLANT
YANKAUER SUCT BULB TIP NO VENT (SUCTIONS) ×4 IMPLANT

## 2021-05-20 NOTE — H&P (Signed)
Candace Barry is an 25 y.o. female.    Chief Complaint:  Nasal polyps  HPI: Patient presents today for planned elective procedure.  She denies any interval change in history since office visit on 02/19/2021:  Candace Barry is a 25 y.o. female who presents as a return patient, referred by [place], Crisoforo Oxford*, to discuss options following completion of oral steroid treatment for nasal polyposis and chronic sinusitis. Patient endorses some improvement in her symptoms while on the oral steroids, but states that approximately 10 days after completing the steroids, she again lost her sense of smell and has experienced increased nasal congestion. She denies significant facial pressure or facial pain. She endorses longstanding history of seasonal allergies and asthma. She has not been formally allergy tested, but is interested in allergy referral.  Past Medical History:  Diagnosis Date   Asthma    History of wheezing     Past Surgical History:  Procedure Laterality Date   MOUTH SURGERY      Family History  Problem Relation Age of Onset   Diabetes Mother    Healthy Father     Social History:  reports that she has never smoked. She has never used smokeless tobacco. She reports that she does not drink alcohol and does not use drugs.  Allergies: No Known Allergies  Medications Prior to Admission  Medication Sig Dispense Refill   acetaminophen (TYLENOL) 325 MG tablet Take 650 mg by mouth every 6 (six) hours as needed.     albuterol (VENTOLIN HFA) 108 (90 Base) MCG/ACT inhaler Inhale 2 puffs into the lungs every 6 (six) hours as needed. Shortness of breath 18 g 0   budesonide-formoterol (SYMBICORT) 160-4.5 MCG/ACT inhaler Inhale 2 puffs into the lungs 2 (two) times daily. 1 Inhaler 0   cetirizine (ZYRTEC) 10 MG tablet Take 10 mg by mouth daily as needed. allergies     Levonorgestrel 13.5 MG IUD by Intrauterine route.     montelukast (SINGULAIR) 10 MG tablet Take 1 tablet (10 mg total) by  mouth at bedtime. 30 tablet 0    Results for orders placed or performed during the hospital encounter of 05/20/21 (from the past 48 hour(s))  Pregnancy, urine POC     Status: None   Collection Time: 05/20/21  6:33 AM  Result Value Ref Range   Preg Test, Ur NEGATIVE NEGATIVE    Comment:        THE SENSITIVITY OF THIS METHODOLOGY IS >24 mIU/mL    No results found.  ROS: ROS  Blood pressure 128/84, pulse 91, temperature 98.5 F (36.9 C), temperature source Oral, resp. rate 18, height 5\' 5"  (1.651 m), weight 78.3 kg, SpO2 100 %.  PHYSICAL EXAM: Physical Exam  Studies Reviewed: CT Sinus Reviewed- demonstrating partial to total opacification of all paranasal sinuses, mild left septal deviation, bilateral inferior turbinate hypertrophy.   Assessment/Plan Candace Barry is a 25 y.o. female with ongoing history of chronic nasal congestion, chronic sinusitis, anosmia, polyposis. To OR for bilateral functional endoscopic sinus surgery with image guidance, with bilateral maxillary antrostomy, total ethmoidectomy, sphenoidotomy, frontal sinusotomy, bilateral inferior turbinate reduction, possible septoplasty, with placement of propel stents. Risks of surgery, including bleeding, meningitis, leakage of cerebral spinal fluid, eye injury, injury to the carotid artery, injury to the tear duct system causing excessive tearing, numbness of the upper teeth and gums, damage to the olfactory nerves causing loss of smell, excessive crust formation after the operation, and recurrent polyposis as well as benefits, and expected postoperative recovery  were reviewed with patient. She expressed understanding and agreement.    Maddex Garlitz A Shanica Castellanos 05/20/2021, 7:25 AM

## 2021-05-20 NOTE — Discharge Instructions (Addendum)
[facility]) Post Operative Instructions  Office: [phone]  The Surgery Itself Endoscopic sinus surgery (with or without septoplasty and turbinate reduction) involves general anesthesia, typically for one to two hours. Patients may be sedated for several hours after surgery and may remain sleepy for the better part of the day. Nausea and vomiting are occasionally seen, and usually resolve by the evening of surgery - even without additional medications. Almost all patients can go home the day of surgery.  After Surgery  Facial pressure and fullness similar to a sinus infection/headache is normal after surgery. Breathing through your nose is also difficult due to swelling. A humidifier or vaporizer can be used in the bedroom to prevent throat pain with mouth breathing.   Bloody nasal drainage is normal after this surgery for 5-7 days, usually decreasing in volume with each day that passes. Drainage will flow from the front of the nose and down the back of the throat. Make sure you spit out blood drainage that drips down the back of your throat to prevent nausea/vomiting. You will have a nasal drip pad/sling with gauze to catch drainage from the front of your nose. The dressing may need to be changed frequently during the first 24 hours following surgery. In case of profuse nasal bleeding, you may apply ice to the bridge of the nose and pinch the nose just above the tip and hold for 10 minutes; if bleeding continues, contact the doctors office.   Frequent hot showers or saline nasal rinses (NeilMed) will help break up congestion and clear any clot or mucus that builds up within the nose after surgery. This can be started the day after surgery.   It is more comfortable to sleep with extra pillows or in a recliner for the first few days after surgery until the drainage begins to resolve.    Do not blow your nose for 2 weeks after surgery.   Avoid lifting > 10 lbs. and no  vigorous exercise for 2 weeks after Surgery.   Avoid airplane travel for 2 weeks following sinus surgery; the cabin pressure changes can cause pain and swelling within the nose/sinuses.   Sense of smell and taste are often diminished for several weeks after surgery. There may be some tenderness or numbness in your upper front teeth, which is normal after surgery. You may express old clot, discolored mucus or very large nasal crusts from your nose for up to 3-4 weeks after surgery; depending on how frequently and how effectively you irrigate your nose with the saltwater spray.   You may have absorbable sutures inside of your nose after surgery that will slowly dissolve in 2-3 weeks. Be careful when clearing crusts from the nose since they may be attached to these sutures.  Medications  Pain medication can be used for pain as prescribed. Pain and pressure in the nose is expected after surgery. As the surgical site heals, pain will resolve over the course of a week. Pain medications can cause nausea, which can be prevented if you take them with food or milk.   You may be given an antibiotic for one week after surgery to prevent infection. Take this medication with food to prevent nausea or vomiting.   You can use 2 nasal sprays after surgery: Afrin can be used up to 2 times a day for up to 5 days after surgery (best before bed) to reduce bloody drainage from the nose for the first few days after surgery. Saline/salt   water spray can be used as often as you would like starting the day after surgery to prevent crusting inside of the nose.   Take all of your routine medications as prescribed, unless told otherwise by your surgeon. Any medications that thin the blood should be avoided. This includes aspirin. Avoid aspirin-like products for the first 72 hours after surgery (Advil, Motrin, Excedrin, Alieve, Celebrex, Naprosyn), but you may use them as needed for pain after 72 hours.    Post Anesthesia Home  Care Instructions  Activity: Get plenty of rest for the remainder of the day. A responsible individual must stay with you for 24 hours following the procedure.  For the next 24 hours, DO NOT: -Drive a car -Operate machinery -Drink alcoholic beverages -Take any medication unless instructed by your physician -Make any legal decisions or sign important papers.  Meals: Start with liquid foods such as gelatin or soup. Progress to regular foods as tolerated. Avoid greasy, spicy, heavy foods. If nausea and/or vomiting occur, drink only clear liquids until the nausea and/or vomiting subsides. Call your physician if vomiting continues.  Special Instructions/Symptoms: Your throat may feel dry or sore from the anesthesia or the breathing tube placed in your throat during surgery. If this causes discomfort, gargle with warm salt water. The discomfort should disappear within 24 hours.  If you had a scopolamine patch placed behind your ear for the management of post- operative nausea and/or vomiting:  1. The medication in the patch is effective for 72 hours, after which it should be removed.  Wrap patch in a tissue and discard in the trash. Wash hands thoroughly with soap and water. 2. You may remove the patch earlier than 72 hours if you experience unpleasant side effects which may include dry mouth, dizziness or visual disturbances. 3. Avoid touching the patch. Wash your hands with soap and water after contact with the patch.     

## 2021-05-20 NOTE — Op Note (Signed)
OPERATIVE NOTE  Candace Barry Date/Time of Admission: 05/20/2021  6:20 AM  CSN: [id number] Attending Provider: Cheron Schaumann A, DO Room/Bed: [facility]SP/NONE DOB: [birth year] Age: 25 y.o.   Pre-Op Diagnosis: Nasal polyposis; Allergic rhinitis, Septal deviation; Chronic pansinusitis  Post-Op Diagnosis: Nasal polyposis; Allergic rhinitis, Septal deviation; Chronic pansinusitis  Procedure: Procedure(s): BILATERAL FUNCTIONAL ENDOSCOPIC SINUS SURGERY WITH IMAGE GUIDANCE (27035); BILATERAL MAXILLARY ANTROSTOMY (00938-18); TOTAL ETHMOIDECTOMY; SPHENOIDOTOMY WITH TISSUE REMOVAL (29937-16); FRONTAL SINUSOTOMY WITH TISSUE REMOVAL (96789-38); PLACEMENT OF PROPEL STENTS BILATERAL INFERIOR TURBINATE REDUCTION (30140-50); SEPTOPLASTY  (10175)  Anesthesia: General  Surgeon(s): Laren Boom, DO  Staff: Circulator: Valda Lamb, RN Scrub Person: Marisa Severin, Charmaine Downs Vendor Representative : Debbe Mounts  Implants: Implant Name Type Inv. Item Serial No. Manufacturer Lot No. LRB No. Used Action  IMPLANT PROPEL CONTOUR - [id number] Prosthesis and Implant ENT IMPLANT PROPEL CONTOUR [id number] INTERSECT ENT [id number] Left 1 Implanted  IMPLANT PROPEL CONTOUR - [id number] Prosthesis and Implant ENT IMPLANT PROPEL CONTOUR [id number] INTERSECT ENT [id number] Right 1 Implanted    Specimens: ID Type Source Tests Collected by Time Destination  1 : Bilateral sinus contents Tissue PATH Other SURGICAL PATHOLOGY [name] A, DO 05/20/2021 0828     Complications: None  EBL: 50 ML  Condition: stable  Operative Findings:  Septal deviation to left with spur, chronic inflammatory mucosal changes with polypoid edema, thick, inspissated secretions in bilateral frontal sinuses.   Description of Operation: Once operative consent was obtained and the site and surgery were confirmed with the patient and the operating room team, the patient was brought back to the  operating room and general endotracheal anesthesia was obtained. The image-guided system was attached and noted to be in good calibration. Lidocaine 1% with 1:100,000 epinephrine was injected into the nasal septum bilaterally, inferior turbinates bilaterally, the middle turbinates bilaterally, and the axilla between the medial turbinate and the lateral nasal wall. Afrin-soaked pledgets were placed into the nasal cavity, and the patient was prepped and draped in sterile fashion. Attention was first turned to the left nasal vestibule. A left sided hemi-transfixion incision was made a submucoperichondrial flap was elevated on the left.  A submucous resection of  nasal septal cartilage was performed with care taken to leave a 1 cm caudal and dorsal strut. In doing this, a right-sided submucoperichondrial flap was elevated.  The bony nasal septum was addressed by lifting up the soft tissues, separating the superior septum with a double action scissor and then removing the inferior deflected portion. The nasal spine was removed using the Risk analyst and Lenoria Chime. With this completed, the nasal septum was midline. The submucoperichondrial flaps were returned to their anatomic position and hemi-transfixion incision was closed with interrupted 4-0 chromic gut. A 4-0 plain gut suture was used to perform a mattress style stitch in a circular direction from anterior to posterior septum to re-approximate the right and left sided flaps.  Attention then turned to the right-sided sinonasal cavity. The middle turbinate was medialized and a ball-tipped seeker was used to slightly anterior fracture the uncinate process.  The frontal recess was dilated to 6 mm in two locations with a navigatable frontal balloon. The inferior frontal recess was then explored and widened. Inspissated secretions and thickened mucosa was removed from the sinus using a combination of curved suction, frontal sinus seeker, and the 40 degree microdebrider  until the frontal recess was widely patent. Attention was then turned to the uncinate process, which was completely fractured anteriorly and then removed with  a combination of backbiter and a microdebrider. A ball-tipped seeker was used to identify the natural os of the maxillary sinus, and it was widened with combination of the backbiter, the microdebrider, the olive tip suction, and the straight TruCut until it was widely patent. Polypoid tissue was removed. The frontal and maxillary sinus cavities were irrigated. Utilizing image-guided suction, the medial inferior quadrant of the ethmoid bulla was entered bluntly, the walls were fractured and the bulla was removed utilizing a microdebrider. Anterior and posterior ethmoidectomy were completed in a standard fashion by first locating the anterior face of the sphenoid sinus and then following the skull base and the lamina papyracea to fully take down all ethmoid cells. This was done utilizing a combination of the straight suction, the J curette, the ball-tipped seeker, and the microdebrider. The os of the sphenoid sinus was identified medially and inferiorly and entered with image guided suction. It was then widened with a micro debrider and sphenoid punch.  Polypoid tissue was removed from the sinus and os. Attention was then turned to the inferior turbinate. It was outfractured and then submucous resection was performed by making an incision in the leading edge with a 15 blade, separating the mucosa from bone with a Cottle elevator and then using the micro debrider via a turbinate blade to remove bone.  After the sinus was copiously irrigated,  a contour Propel stent was placed in the frontal recess, ensuring good contact with tissue circumferentially. Arista was then placed in the nasal cavity, and Nasopore nasal packing was placed lateral to the middle turbinate in the axilla between it and the lateral nasal wall. Attention was then turned to the left side. This  exact same procedure was performed on the left side of the sinonasal cavity. Attention was then turned to the inferior turbinates.They were outfractured and then submucous resection was performed by making an incision in the leading edge with a 15 blade, separating the mucosa from bone with a Cottle elevator and then using the micro debrider with a turbinate blade to remove bone.  Doyle nasal splints were placed in the bilateral nasal cavities and sutured to the columella with a 2-0 silk suture and a nasal drip pad was applied. An orogastric tube was placed and the stomach cavity was suctioned to reduce postoperative nausea. The patient was turned over to anesthesia service and was extubated in the operating room and transferred to the PACU in stable condition. The patient will be discharged today and followed up in the ENT clinic in 1 week for postoperative check.   Laren Boom, DO [facility]  05/20/2021

## 2021-05-20 NOTE — Anesthesia Preprocedure Evaluation (Signed)
Anesthesia Evaluation  Patient identified by MRN, date of birth, ID band Patient awake  General Assessment Comment:Nasal polyposis; Allergic rhinitis; Chronic pansinusitis  Reviewed: Allergy & Precautions, NPO status , Patient's Chart, lab work & pertinent test results  Airway Mallampati: II  TM Distance: >3 FB Neck ROM: Full    Dental  (+) Teeth Intact, Dental Advisory Given   Pulmonary asthma ,    Pulmonary exam normal breath sounds clear to auscultation       Cardiovascular negative cardio ROS Normal cardiovascular exam Rhythm:Regular Rate:Normal     Neuro/Psych negative neurological ROS     GI/Hepatic negative GI ROS, Neg liver ROS,   Endo/Other  negative endocrine ROS  Renal/GU negative Renal ROS     Musculoskeletal negative musculoskeletal ROS (+)   Abdominal   Peds  Hematology negative hematology ROS (+)   Anesthesia Other Findings Day of surgery medications reviewed with the patient.  Reproductive/Obstetrics                             Anesthesia Physical Anesthesia Plan  ASA: 2  Anesthesia Plan: General   Post-op Pain Management:    Induction: Intravenous  PONV Risk Score and Plan: 4 or greater and Midazolam, Dexamethasone and Ondansetron  Airway Management Planned: Oral ETT  Additional Equipment:   Intra-op Plan:   Post-operative Plan: Extubation in OR  Informed Consent: I have reviewed the patients History and Physical, chart, labs and discussed the procedure including the risks, benefits and alternatives for the proposed anesthesia with the patient or authorized representative who has indicated his/her understanding and acceptance.     Dental advisory given  Plan Discussed with: CRNA  Anesthesia Plan Comments:         Anesthesia Quick Evaluation

## 2021-05-20 NOTE — Anesthesia Postprocedure Evaluation (Signed)
Anesthesia Post Note  Patient: Candace Barry  Procedure(s) Performed: BILATERAL FUNCTIONAL ENDOSCOPIC SINUS SURGERY WITH IMAGE GUIDANCE; FRONTAL SINUSOTOMY; PLACEMENT OF PROPEL STENTS (Bilateral: Nose) BILATERAL INFERIOR TURBINATE REDUCTION (Bilateral: Nose) SEPTOPLASTY WITH ETHMOIDECTOMY, AND MAXILLARY ANTROSTOMY WITH TISSUE REMOVAL (Bilateral: Nose) SPHENOIDECTOMY (Bilateral: Nose) FRONTAL RECESS AXILLARY AND TISSUE REMOVAL (Bilateral: Nose)     Patient location during evaluation: PACU Anesthesia Type: General Level of consciousness: awake and alert Pain management: pain level controlled Vital Signs Assessment: post-procedure vital signs reviewed and stable Respiratory status: spontaneous breathing, nonlabored ventilation and respiratory function stable Cardiovascular status: blood pressure returned to baseline and stable Postop Assessment: no apparent nausea or vomiting Anesthetic complications: no   No notable events documented.  Last Vitals:  Vitals:   05/20/21 1045 05/20/21 1100  BP: 134/86 137/78  Pulse: (!) 103 99  Resp: 13 13  Temp:    SpO2: 97% 94%    Last Pain:  Vitals:   05/20/21 1100  TempSrc:   PainSc: 7                  Cecile Hearing

## 2021-05-20 NOTE — Anesthesia Procedure Notes (Signed)
Procedure Name: Intubation Date/Time: 05/20/2021 7:40 AM Performed by: Lavonia Dana, CRNA Pre-anesthesia Checklist: Patient identified, Emergency Drugs available, Suction available and Patient being monitored Patient Re-evaluated:Patient Re-evaluated prior to induction Oxygen Delivery Method: Circle system utilized Preoxygenation: Pre-oxygenation with 100% oxygen Induction Type: IV induction Ventilation: Mask ventilation without difficulty Laryngoscope Size: Mac and 3 Grade View: Grade I Tube type: Oral Tube size: 7.0 mm Number of attempts: 1 Airway Equipment and Method: Stylet and Oral airway Placement Confirmation: ETT inserted through vocal cords under direct vision, positive ETCO2 and breath sounds checked- equal and bilateral Secured at: 22 cm Tube secured with: Tape Dental Injury: Teeth and Oropharynx as per pre-operative assessment

## 2021-05-20 NOTE — Transfer of Care (Signed)
Immediate Anesthesia Transfer of Care Note  Patient: Candace Barry  Procedure(s) Performed: BILATERAL FUNCTIONAL ENDOSCOPIC SINUS SURGERY WITH IMAGE GUIDANCE; MAXILLARY ANTROSTOMY; TOTAL ETHMOIDECTOMY; SPHENOIDOTOMY; FRONTAL SINUSOTOMY; PLACEMENT OF PROPEL STENTS (Bilateral: Nose) BILATERAL INFERIOR TURBINATE REDUCTION; SEPTOPLASTY [815] (Bilateral: Nose) SEPTOPLASTY WITH ETHMOIDECTOMY, AND MAXILLARY ANTROSTOMY (Bilateral: Nose)  Patient Location: PACU  Anesthesia Type:General  Level of Consciousness: drowsy  Airway & Oxygen Therapy: Patient Spontanous Breathing and Patient connected to face mask oxygen  Post-op Assessment: Report given to RN and Post -op Vital signs reviewed and stable  Post vital signs: Reviewed and stable  Last Vitals:  Vitals Value Taken Time  BP 137/82 05/20/21 1032  Temp    Pulse 100 05/20/21 1034  Resp 20 05/20/21 1034  SpO2 99 % 05/20/21 1034  Vitals shown include unvalidated device data.  Last Pain:  Vitals:   05/20/21 0652  TempSrc: Oral  PainSc: 0-No pain      Patients Stated Pain Goal: 4 (05/20/21 9528)  Complications: No notable events documented.

## 2021-05-21 ENCOUNTER — Encounter (HOSPITAL_BASED_OUTPATIENT_CLINIC_OR_DEPARTMENT_OTHER): Payer: Self-pay | Admitting: Otolaryngology

## 2021-05-21 LAB — SURGICAL PATHOLOGY

## 2022-11-19 ENCOUNTER — Encounter (HOSPITAL_COMMUNITY): Payer: Self-pay | Admitting: *Deleted

## 2022-11-19 ENCOUNTER — Ambulatory Visit (HOSPITAL_COMMUNITY)
Admission: EM | Admit: 2022-11-19 | Discharge: 2022-11-19 | Disposition: A | Discharge status: Home / Self Care | Payer: Medicaid Other

## 2022-11-19 DIAGNOSIS — S46811A Strain of other muscles, fascia and tendons at shoulder and upper arm level, right arm, initial encounter: Secondary | ICD-10-CM | POA: Diagnosis not present

## 2022-11-19 DIAGNOSIS — S61001A Unspecified open wound of right thumb without damage to nail, initial encounter: Secondary | ICD-10-CM | POA: Diagnosis not present

## 2022-11-19 MED ORDER — METHOCARBAMOL 500 MG PO TABS: 500.0000 mg | ORAL_TABLET | Freq: Two times a day (BID) | ORAL | 0 refills | Status: DC
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
## 2022-11-20 ENCOUNTER — Encounter (HOSPITAL_BASED_OUTPATIENT_CLINIC_OR_DEPARTMENT_OTHER): Payer: Self-pay | Admitting: Emergency Medicine

## 2022-11-20 ENCOUNTER — Other Ambulatory Visit: Payer: Self-pay

## 2022-11-20 ENCOUNTER — Emergency Department (HOSPITAL_BASED_OUTPATIENT_CLINIC_OR_DEPARTMENT_OTHER): Payer: Medicaid Other

## 2022-11-20 ENCOUNTER — Emergency Department (HOSPITAL_BASED_OUTPATIENT_CLINIC_OR_DEPARTMENT_OTHER)
Admission: EM | Admit: 2022-11-20 | Discharge: 2022-11-20 | Disposition: A | Discharge status: Home / Self Care | Payer: Medicaid Other | Attending: Emergency Medicine | Admitting: Emergency Medicine

## 2022-11-20 DIAGNOSIS — R519 Headache, unspecified: Secondary | ICD-10-CM | POA: Diagnosis not present

## 2022-11-20 DIAGNOSIS — Y92410 Unspecified street and highway as the place of occurrence of the external cause: Secondary | ICD-10-CM | POA: Insufficient documentation

## 2022-11-20 DIAGNOSIS — R0789 Other chest pain: Secondary | ICD-10-CM | POA: Diagnosis present

## 2022-11-20 MED ORDER — ACETAMINOPHEN 500 MG PO TABS
1000.0000 mg | ORAL_TABLET | Freq: Once | ORAL | Status: AC
  Administered 2022-11-20: 1000 mg via ORAL
  Filled 2022-11-20: qty 2

## 2022-11-20 NOTE — Discharge Instructions (Addendum)
It was a pleasure taking care of you today!  Your imaging in the ED was negative for fracture or dislocations.  You may take your Robaxin as prescribed, do not drive or operate heavy machinery while taking this muscle relaxer as it can make you sleepy/drowsy. You may take over the counter 600 mg ibuprofen every 6 hours and alternate with 500 mg Tylenol every 6 hours for no more than 7 days. You may apply ice or heat to affected area for up to 15 minutes at a time. Ensure to place a barrier between your skin and the ice/heat. Return to the Emergency Department if you are experiencing increasing/worsening symptoms.
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
## 2022-11-30 ENCOUNTER — Encounter: Payer: Self-pay | Admitting: Physical Therapy

## 2022-11-30 ENCOUNTER — Other Ambulatory Visit: Payer: Self-pay

## 2022-11-30 ENCOUNTER — Ambulatory Visit: Payer: Medicaid Other | Attending: Physician Assistant | Admitting: Physical Therapy

## 2022-11-30 DIAGNOSIS — R252 Cramp and spasm: Secondary | ICD-10-CM | POA: Diagnosis present

## 2022-11-30 DIAGNOSIS — M25552 Pain in left hip: Secondary | ICD-10-CM

## 2022-11-30 DIAGNOSIS — M542 Cervicalgia: Secondary | ICD-10-CM | POA: Diagnosis present

## 2022-11-30 DIAGNOSIS — M5459 Other low back pain: Secondary | ICD-10-CM

## 2022-11-30 DIAGNOSIS — M25551 Pain in right hip: Secondary | ICD-10-CM
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
## 2022-12-07 ENCOUNTER — Ambulatory Visit: Payer: Medicaid Other | Admitting: Physical Therapy
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
## 2022-12-09 ENCOUNTER — Ambulatory Visit: Payer: Medicaid Other | Admitting: Physical Therapy

## 2022-12-09 ENCOUNTER — Encounter: Payer: Self-pay | Admitting: Physical Therapy

## 2022-12-09 DIAGNOSIS — M542 Cervicalgia: Secondary | ICD-10-CM

## 2022-12-09 DIAGNOSIS — M5459 Other low back pain: Secondary | ICD-10-CM

## 2022-12-09 DIAGNOSIS — R252 Cramp and spasm: Secondary | ICD-10-CM | POA: Diagnosis not present

## 2022-12-09 DIAGNOSIS — M25552 Pain in left hip: Secondary | ICD-10-CM

## 2022-12-09 DIAGNOSIS — M25551 Pain in right hip: Secondary | ICD-10-CM

## 2022-12-14 ENCOUNTER — Ambulatory Visit: Payer: Medicaid Other | Admitting: Physical Therapy

## 2022-12-14 ENCOUNTER — Encounter: Payer: Self-pay | Admitting: Physical Therapy

## 2022-12-14 DIAGNOSIS — M542 Cervicalgia: Secondary | ICD-10-CM

## 2022-12-14 DIAGNOSIS — M5459 Other low back pain: Secondary | ICD-10-CM

## 2022-12-14 DIAGNOSIS — M25551 Pain in right hip: Secondary | ICD-10-CM

## 2022-12-14 DIAGNOSIS — R252 Cramp and spasm: Secondary | ICD-10-CM | POA: Diagnosis not present

## 2022-12-14 DIAGNOSIS — M25552 Pain in left hip: Secondary | ICD-10-CM
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
## 2022-12-16 ENCOUNTER — Ambulatory Visit: Payer: Medicaid Other | Admitting: Physical Therapy

## 2022-12-21 ENCOUNTER — Encounter: Payer: Medicaid Other | Admitting: Physical Therapy

## 2022-12-23 ENCOUNTER — Ambulatory Visit: Payer: Medicaid Other | Admitting: Physical Therapy

## 2022-12-28 ENCOUNTER — Encounter: Payer: Medicaid Other | Admitting: Physical Therapy

## 2022-12-30 ENCOUNTER — Encounter: Payer: Medicaid Other | Admitting: Physical Therapy

## 2023-01-04 ENCOUNTER — Encounter: Payer: Medicaid Other | Admitting: Physical Therapy

## 2023-01-11 ENCOUNTER — Encounter: Payer: Medicaid Other | Admitting: Physical Therapy

## 2023-01-13 ENCOUNTER — Encounter: Payer: Medicaid Other | Admitting: Physical Therapy

## 2023-01-18 ENCOUNTER — Ambulatory Visit: Payer: Medicaid Other | Attending: Physician Assistant | Admitting: Physical Therapy

## 2023-01-18 DIAGNOSIS — M25552 Pain in left hip: Secondary | ICD-10-CM | POA: Insufficient documentation

## 2023-01-18 DIAGNOSIS — R252 Cramp and spasm: Secondary | ICD-10-CM | POA: Insufficient documentation

## 2023-01-18 DIAGNOSIS — M5459 Other low back pain: Secondary | ICD-10-CM | POA: Insufficient documentation

## 2023-01-18 DIAGNOSIS — M25551 Pain in right hip: Secondary | ICD-10-CM | POA: Insufficient documentation

## 2023-01-18 DIAGNOSIS — M542 Cervicalgia: Secondary | ICD-10-CM | POA: Insufficient documentation

## 2024-02-15 ENCOUNTER — Encounter (HOSPITAL_COMMUNITY): Payer: Self-pay | Admitting: *Deleted

## 2024-02-15 ENCOUNTER — Ambulatory Visit (HOSPITAL_COMMUNITY)
Admission: EM | Admit: 2024-02-15 | Discharge: 2024-02-15 | Disposition: A | Discharge status: Home / Self Care | Attending: Family Medicine | Admitting: Family Medicine

## 2024-02-15 ENCOUNTER — Ambulatory Visit (INDEPENDENT_AMBULATORY_CARE_PROVIDER_SITE_OTHER)

## 2024-02-15 DIAGNOSIS — M533 Sacrococcygeal disorders, not elsewhere classified: Secondary | ICD-10-CM

## 2024-02-15 HISTORY — DX: Migraine, unspecified, not intractable, without status migrainosus: G43.909

## 2024-02-15 MED ORDER — HYDROCODONE-ACETAMINOPHEN 5-325 MG PO TABS: 1.0000 | ORAL_TABLET | Freq: Four times a day (QID) | ORAL | 0 refills | Status: AC | PRN

## 2024-02-15 NOTE — Discharge Instructions (Signed)
 Be aware, you have been prescribed pain medications that may cause drowsiness. While taking this medication, do not take any other medications containing acetaminophen (Tylenol). Do not combine with alcohol or recreational drugs. Please do not drive, operate heavy machinery, or take part in activities that require making important decisions while on this medication as your judgement may be clouded.

## 2024-02-15 NOTE — ED Triage Notes (Signed)
 Reports slipping on bottom steps at a venue, falling straight onto back on steps approx 1 hr ago. Denies head injury or LOC. C/O pain to coccyx and bilat trapezius area.

## 2024-02-18 NOTE — ED Provider Notes (Signed)
 [facility]   [id number] 02/15/24 Arrival Time: 1902  ASSESSMENT & PLAN:  1. Coccyx pain    I have personally viewed and independently interpreted the imaging studies ordered this visit. Coccyx: no fx appreciated.  Able to ambulate here and hemodynamically stable.  Meds ordered this encounter  Medications   HYDROcodone -acetaminophen  (NORCO/VICODIN) 5-325 MG tablet    Sig: Take 1 tablet by mouth every 6 (six) hours as needed for moderate pain (pain score 4-6) or severe pain (pain score 7-10).    Dispense:  12 tablet    Refill:  0   Work/school excuse note: provided. Medication sedation precautions given. Encourage ROM/movement as tolerated. May f/u as needed.   Reviewed expectations re: course of current medical issues. Questions answered. Outlined signs and symptoms indicating need for more acute intervention. Patient verbalized understanding. After Visit Summary given.   SUBJECTIVE: History from: patient.  Candace Barry is a 28 y.o. female who presents with complaint of slipping on bottom steps at a venue, falling straight onto back on steps approx 1 hr ago. Denies head injury or LOC. C/O pain to coccyx and bilat trapezius area. Able to ambulate. Normal bladder habits without hematuria. Denies extremity sensation changes or weakness. No tx PTA.   OBJECTIVE:  Vitals:   02/15/24 1911  BP: 126/85  Pulse: 87  Resp: 16  Temp: 97.8 F (36.6 C)  TempSrc: Oral  SpO2: 98%    General appearance: alert; no distress HEENT: [place]; AT Neck: supple with FROM; without midline tenderness CV: regular Lungs: unlabored respirations; speaks full sentences without difficulty Abdomen: soft, non-tender; non-distended Back: moderate and well localized tenderness to palpation over coccyx; FROM at waist; bruising: none; without midline tenderness Extremities: without edema; symmetrical without gross deformities; normal ROM of bilateral LE Skin: warm and dry Neurologic:  normal gait; normal sensation and strength of bilateral LE Psychological: alert and cooperative; normal mood and affect   Imaging: DG Sacrum/Coccyx Result Date: 02/15/2024 CLINICAL DATA:  Fall with coccygeal pain. EXAM: SACRUM AND COCCYX - 2+ VIEW COMPARISON:  None Available. FINDINGS: Coccyx is largely obscured by fecal material. No definite fracture on the lateral view. Intrauterine contraceptive device in place. IMPRESSION: No acute findings. Electronically Signed   By: Newell Eke M.D.   On: 02/15/2024 20:14    No Known Allergies  Past Medical History:  Diagnosis Date   Asthma    History of wheezing    Migraines    Social History   Socioeconomic History   Marital status: Single    Spouse name: Not on file   Number of children: Not on file   Years of education: Not on file   Highest education level: Not on file  Occupational History   Not on file  Tobacco Use   Smoking status: Never   Smokeless tobacco: Never  Vaping Use   Vaping status: Never Used  Substance and Sexual Activity   Alcohol use: Yes    Comment: socially   Drug use: No   Sexual activity: Yes    Birth control/protection: I.U.D.  Other Topics Concern   Not on file  Social History Narrative   Not on file   Social Drivers of Health   Financial Resource Strain: Not on file  Food Insecurity: Not on file  Transportation Needs: Not on file  Physical Activity: Not on file  Stress: Not on file  Social Connections: Not on file  Intimate Partner Violence: Not on file   Family History  Problem Relation  Age of Onset   Diabetes Mother    Hypercholesterolemia Mother    Hypertension Mother    Healthy Father    Past Surgical History:  Procedure Laterality Date   FRONTAL SINUS EXPLORATION Bilateral 05/20/2021   Procedure: FRONTAL RECESS AXILLARY AND TISSUE REMOVAL;  Surgeon: Llewellyn Gerard LABOR, DO;  Location: [facility];  Service: ENT;  Laterality: Bilateral;   MOUTH SURGERY      SEPTOPLASTY WITH ETHMOIDECTOMY, AND MAXILLARY ANTROSTOMY Bilateral 05/20/2021   Procedure: SEPTOPLASTY WITH ETHMOIDECTOMY, AND MAXILLARY ANTROSTOMY WITH TISSUE REMOVAL;  Surgeon: Llewellyn Gerard LABOR, DO;  Location: [facility];  Service: ENT;  Laterality: Bilateral;   SINUS ENDO W/FUSION Bilateral 05/20/2021   Procedure: BILATERAL FUNCTIONAL ENDOSCOPIC SINUS SURGERY WITH IMAGE GUIDANCE; FRONTAL SINUSOTOMY; PLACEMENT OF PROPEL STENTS;  Surgeon: Llewellyn Gerard LABOR, DO;  Location: [facility];  Service: ENT;  Laterality: Bilateral;   SPHENOIDECTOMY Bilateral 05/20/2021   Procedure: SPHENOIDECTOMY;  Surgeon: Llewellyn Gerard LABOR, DO;  Location: [facility];  Service: ENT;  Laterality: Bilateral;   TURBINATE REDUCTION Bilateral 05/20/2021   Procedure: BILATERAL INFERIOR TURBINATE REDUCTION;  Surgeon: Llewellyn Gerard LABOR, DO;  Location: [facility];  Service: ENT;  Laterality: Coral Rolinda Rogue, MD 02/18/24 1020

## 2024-02-24 ENCOUNTER — Ambulatory Visit
Admission: RE | Admit: 2024-02-24 | Discharge: 2024-02-24 | Disposition: A | Discharge status: Home / Self Care | Payer: Self-pay | Source: Ambulatory Visit

## 2024-02-24 VITALS — BP 107/72 | HR 113 | Temp 98.4°F | Resp 18 | Ht 65.0 in | Wt 166.0 lb

## 2024-02-24 DIAGNOSIS — M546 Pain in thoracic spine: Secondary | ICD-10-CM | POA: Diagnosis not present

## 2024-02-24 DIAGNOSIS — M545 Low back pain, unspecified: Secondary | ICD-10-CM | POA: Diagnosis not present

## 2024-02-24 MED ORDER — CYCLOBENZAPRINE HCL 10 MG PO TABS: 10.0000 mg | ORAL_TABLET | Freq: Two times a day (BID) | ORAL | 0 refills | Status: AC | PRN

## 2024-02-24 MED ORDER — PREDNISONE 20 MG PO TABS: 40.0000 mg | ORAL_TABLET | Freq: Every day | ORAL | 0 refills | Status: AC

## 2024-02-24 NOTE — ED Triage Notes (Signed)
 I had a slip and fall last week, I went to the urgent care that day and not able to see my provider immediately and out of the medication and going out of town and need a Rx.
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
